# Patient Record
Sex: Female | Born: 1940 | Race: White | Hispanic: No | Smoking: Never smoker
Health system: Southern US, Community
[De-identification: ages and names within clinical notes are randomized; demographics above are authoritative.]

## PROBLEM LIST (undated history)

## (undated) DIAGNOSIS — M1990 Unspecified osteoarthritis, unspecified site: Secondary | ICD-10-CM

## (undated) DIAGNOSIS — F32A Depression, unspecified: Secondary | ICD-10-CM

## (undated) DIAGNOSIS — F329 Major depressive disorder, single episode, unspecified: Secondary | ICD-10-CM

## (undated) DIAGNOSIS — C801 Malignant (primary) neoplasm, unspecified: Secondary | ICD-10-CM

## (undated) DIAGNOSIS — I10 Essential (primary) hypertension: Secondary | ICD-10-CM

## (undated) HISTORY — PX: ABDOMINAL HYSTERECTOMY: SHX81

## (undated) HISTORY — PX: NEPHRECTOMY: SHX65

---

## 1999-09-18 ENCOUNTER — Encounter: Admission: RE | Admit: 1999-09-18 | Discharge: 1999-09-18 | Payer: Self-pay | Admitting: Rheumatology

## 1999-09-18 ENCOUNTER — Encounter: Payer: Self-pay | Admitting: Rheumatology

## 2001-01-21 ENCOUNTER — Encounter: Admission: RE | Admit: 2001-01-21 | Discharge: 2001-01-21 | Payer: Self-pay | Admitting: Rheumatology

## 2001-01-21 ENCOUNTER — Encounter: Payer: Self-pay | Admitting: Rheumatology

## 2002-04-22 ENCOUNTER — Encounter: Admission: RE | Admit: 2002-04-22 | Discharge: 2002-04-22 | Payer: Self-pay | Admitting: Rheumatology

## 2002-04-22 ENCOUNTER — Encounter: Payer: Self-pay | Admitting: Rheumatology

## 2002-08-18 ENCOUNTER — Other Ambulatory Visit: Admission: RE | Admit: 2002-08-18 | Discharge: 2002-08-18 | Payer: Self-pay | Admitting: Rheumatology

## 2003-08-03 ENCOUNTER — Encounter: Admission: RE | Admit: 2003-08-03 | Discharge: 2003-08-03 | Payer: Self-pay | Admitting: Rheumatology

## 2004-08-03 ENCOUNTER — Encounter: Admission: RE | Admit: 2004-08-03 | Discharge: 2004-08-03 | Payer: Self-pay | Admitting: Rheumatology

## 2005-08-07 ENCOUNTER — Encounter: Admission: RE | Admit: 2005-08-07 | Discharge: 2005-08-07 | Payer: Self-pay | Admitting: Rheumatology

## 2006-08-09 ENCOUNTER — Encounter: Admission: RE | Admit: 2006-08-09 | Discharge: 2006-08-09 | Payer: Self-pay | Admitting: Internal Medicine

## 2007-08-12 ENCOUNTER — Encounter: Admission: RE | Admit: 2007-08-12 | Discharge: 2007-08-12 | Payer: Self-pay | Admitting: Internal Medicine

## 2008-08-12 ENCOUNTER — Encounter: Admission: RE | Admit: 2008-08-12 | Discharge: 2008-08-12 | Payer: Self-pay | Admitting: Internal Medicine

## 2009-08-17 ENCOUNTER — Encounter: Admission: RE | Admit: 2009-08-17 | Discharge: 2009-08-17 | Payer: Self-pay | Admitting: Internal Medicine

## 2009-09-05 ENCOUNTER — Ambulatory Visit (HOSPITAL_COMMUNITY): Admission: RE | Admit: 2009-09-05 | Discharge: 2009-09-05 | Payer: Self-pay | Admitting: Urology

## 2009-09-21 ENCOUNTER — Inpatient Hospital Stay (HOSPITAL_COMMUNITY): Admission: RE | Admit: 2009-09-21 | Discharge: 2009-09-24 | Payer: Self-pay | Admitting: Urology

## 2009-09-21 ENCOUNTER — Encounter: Payer: Self-pay | Admitting: Urology

## 2010-01-16 ENCOUNTER — Ambulatory Visit (HOSPITAL_COMMUNITY): Admission: RE | Admit: 2010-01-16 | Discharge: 2010-01-16 | Payer: Self-pay | Admitting: Urology

## 2010-05-28 LAB — CBC
HCT: 33.2 % — ABNORMAL LOW (ref 36.0–46.0)
HCT: 38.7 % (ref 36.0–46.0)
Hemoglobin: 13.4 g/dL (ref 12.0–15.0)
MCH: 33.7 pg (ref 26.0–34.0)
MCV: 97.1 fL (ref 78.0–100.0)
RBC: 3.49 MIL/uL — ABNORMAL LOW (ref 3.87–5.11)
RBC: 3.63 MIL/uL — ABNORMAL LOW (ref 3.87–5.11)
RBC: 3.98 MIL/uL (ref 3.87–5.11)
RDW: 12 % (ref 11.5–15.5)
WBC: 10.2 10*3/uL (ref 4.0–10.5)
WBC: 8.9 10*3/uL (ref 4.0–10.5)

## 2010-05-28 LAB — DIFFERENTIAL
Basophils Absolute: 0 10*3/uL (ref 0.0–0.1)
Basophils Relative: 0 % (ref 0–1)
Basophils Relative: 0 % (ref 0–1)
Eosinophils Absolute: 0 10*3/uL (ref 0.0–0.7)
Eosinophils Relative: 0 % (ref 0–5)
Lymphs Abs: 0.4 10*3/uL — ABNORMAL LOW (ref 0.7–4.0)
Lymphs Abs: 0.8 10*3/uL (ref 0.7–4.0)
Monocytes Absolute: 0.6 10*3/uL (ref 0.1–1.0)
Neutro Abs: 7.9 10*3/uL — ABNORMAL HIGH (ref 1.7–7.7)
Neutro Abs: 9.2 10*3/uL — ABNORMAL HIGH (ref 1.7–7.7)
Neutrophils Relative %: 89 % — ABNORMAL HIGH (ref 43–77)

## 2010-05-28 LAB — BASIC METABOLIC PANEL
BUN: 12 mg/dL (ref 6–23)
CO2: 30 mEq/L (ref 19–32)
Calcium: 8.6 mg/dL (ref 8.4–10.5)
Calcium: 9.6 mg/dL (ref 8.4–10.5)
Chloride: 104 mEq/L (ref 96–112)
Creatinine, Ser: 0.94 mg/dL (ref 0.4–1.2)
GFR calc Af Amer: 48 mL/min — ABNORMAL LOW (ref >60)
GFR calc Af Amer: >60 mL/min (ref >60)
GFR calc non Af Amer: 39 mL/min — ABNORMAL LOW (ref >60)
Glucose, Bld: 106 mg/dL — ABNORMAL HIGH (ref 70–99)
Glucose, Bld: 159 mg/dL — ABNORMAL HIGH (ref 70–99)
Glucose, Bld: 186 mg/dL — ABNORMAL HIGH (ref 70–99)
Potassium: 4 mEq/L (ref 3.5–5.1)
Sodium: 142 mEq/L (ref 135–145)

## 2010-05-28 LAB — TYPE AND SCREEN: Antibody Screen: NEGATIVE

## 2010-05-28 LAB — SURGICAL PCR SCREEN: Staphylococcus aureus: NEGATIVE

## 2010-05-28 LAB — ABO/RH: ABO/RH(D): O POS

## 2010-07-07 ENCOUNTER — Other Ambulatory Visit: Payer: Self-pay | Admitting: Internal Medicine

## 2010-07-07 DIAGNOSIS — Z1231 Encounter for screening mammogram for malignant neoplasm of breast: Secondary | ICD-10-CM

## 2010-08-29 ENCOUNTER — Other Ambulatory Visit: Payer: Self-pay | Admitting: Urology

## 2010-08-29 ENCOUNTER — Ambulatory Visit (HOSPITAL_COMMUNITY)
Admission: RE | Admit: 2010-08-29 | Discharge: 2010-08-29 | Disposition: A | Discharge status: Home / Self Care | Payer: Medicare Other | Source: Ambulatory Visit | Attending: Urology | Admitting: Urology

## 2010-08-29 DIAGNOSIS — Z905 Acquired absence of kidney: Secondary | ICD-10-CM | POA: Insufficient documentation

## 2010-08-29 DIAGNOSIS — I10 Essential (primary) hypertension: Secondary | ICD-10-CM | POA: Insufficient documentation

## 2010-08-29 DIAGNOSIS — C649 Malignant neoplasm of unspecified kidney, except renal pelvis: Secondary | ICD-10-CM | POA: Insufficient documentation

## 2010-09-05 ENCOUNTER — Ambulatory Visit
Admission: RE | Admit: 2010-09-05 | Discharge: 2010-09-05 | Disposition: A | Discharge status: Home / Self Care | Payer: Medicare Other | Source: Ambulatory Visit | Attending: Internal Medicine | Admitting: Internal Medicine

## 2010-09-05 DIAGNOSIS — Z1231 Encounter for screening mammogram for malignant neoplasm of breast: Secondary | ICD-10-CM

## 2011-01-26 ENCOUNTER — Ambulatory Visit (HOSPITAL_COMMUNITY)
Admission: RE | Admit: 2011-01-26 | Discharge: 2011-01-26 | Disposition: A | Discharge status: Home / Self Care | Payer: Medicare Other | Source: Ambulatory Visit | Attending: Urology | Admitting: Urology

## 2011-01-26 ENCOUNTER — Other Ambulatory Visit: Payer: Self-pay | Admitting: Urology

## 2011-01-26 DIAGNOSIS — C649 Malignant neoplasm of unspecified kidney, except renal pelvis: Secondary | ICD-10-CM

## 2011-07-30 ENCOUNTER — Other Ambulatory Visit: Payer: Self-pay | Admitting: Urology

## 2011-07-30 ENCOUNTER — Ambulatory Visit (HOSPITAL_COMMUNITY)
Admission: RE | Admit: 2011-07-30 | Discharge: 2011-07-30 | Disposition: A | Discharge status: Home / Self Care | Payer: Medicare Other | Source: Ambulatory Visit | Attending: Urology | Admitting: Urology

## 2011-07-30 DIAGNOSIS — M899 Disorder of bone, unspecified: Secondary | ICD-10-CM | POA: Insufficient documentation

## 2011-07-30 DIAGNOSIS — M4120 Other idiopathic scoliosis, site unspecified: Secondary | ICD-10-CM | POA: Insufficient documentation

## 2011-07-30 DIAGNOSIS — C649 Malignant neoplasm of unspecified kidney, except renal pelvis: Secondary | ICD-10-CM | POA: Insufficient documentation

## 2011-08-01 ENCOUNTER — Other Ambulatory Visit: Payer: Self-pay | Admitting: Internal Medicine

## 2011-08-01 DIAGNOSIS — Z1231 Encounter for screening mammogram for malignant neoplasm of breast: Secondary | ICD-10-CM

## 2011-09-06 ENCOUNTER — Ambulatory Visit
Admission: RE | Admit: 2011-09-06 | Discharge: 2011-09-06 | Disposition: A | Discharge status: Home / Self Care | Payer: Medicare Other | Source: Ambulatory Visit | Attending: Internal Medicine | Admitting: Internal Medicine

## 2011-09-06 DIAGNOSIS — Z1231 Encounter for screening mammogram for malignant neoplasm of breast: Secondary | ICD-10-CM

## 2012-07-28 ENCOUNTER — Other Ambulatory Visit: Payer: Self-pay

## 2012-07-28 DIAGNOSIS — Z1231 Encounter for screening mammogram for malignant neoplasm of breast: Secondary | ICD-10-CM

## 2012-07-29 ENCOUNTER — Other Ambulatory Visit: Payer: Self-pay | Admitting: Urology

## 2012-07-29 ENCOUNTER — Ambulatory Visit (HOSPITAL_COMMUNITY)
Admission: RE | Admit: 2012-07-29 | Discharge: 2012-07-29 | Disposition: A | Discharge status: Home / Self Care | Payer: Medicare Other | Source: Ambulatory Visit | Attending: Urology | Admitting: Urology

## 2012-07-29 DIAGNOSIS — M4120 Other idiopathic scoliosis, site unspecified: Secondary | ICD-10-CM | POA: Insufficient documentation

## 2012-07-29 DIAGNOSIS — C649 Malignant neoplasm of unspecified kidney, except renal pelvis: Secondary | ICD-10-CM

## 2012-07-29 DIAGNOSIS — R059 Cough, unspecified: Secondary | ICD-10-CM | POA: Insufficient documentation

## 2012-07-29 DIAGNOSIS — R05 Cough: Secondary | ICD-10-CM | POA: Insufficient documentation

## 2012-07-29 DIAGNOSIS — Z87891 Personal history of nicotine dependence: Secondary | ICD-10-CM | POA: Insufficient documentation

## 2012-07-29 DIAGNOSIS — I10 Essential (primary) hypertension: Secondary | ICD-10-CM | POA: Insufficient documentation

## 2012-09-08 ENCOUNTER — Ambulatory Visit
Admission: RE | Admit: 2012-09-08 | Discharge: 2012-09-08 | Disposition: A | Discharge status: Home / Self Care | Payer: Medicare Other | Source: Ambulatory Visit

## 2012-09-08 DIAGNOSIS — Z1231 Encounter for screening mammogram for malignant neoplasm of breast: Secondary | ICD-10-CM

## 2013-07-31 ENCOUNTER — Other Ambulatory Visit: Payer: Self-pay | Admitting: Urology

## 2013-07-31 ENCOUNTER — Ambulatory Visit (HOSPITAL_COMMUNITY)
Admission: RE | Admit: 2013-07-31 | Discharge: 2013-07-31 | Disposition: A | Discharge status: Home / Self Care | Payer: Medicare Other | Source: Ambulatory Visit | Attending: Urology | Admitting: Urology

## 2013-07-31 DIAGNOSIS — C649 Malignant neoplasm of unspecified kidney, except renal pelvis: Secondary | ICD-10-CM

## 2013-08-06 ENCOUNTER — Other Ambulatory Visit: Payer: Self-pay

## 2013-08-06 DIAGNOSIS — Z1231 Encounter for screening mammogram for malignant neoplasm of breast: Secondary | ICD-10-CM

## 2013-09-10 ENCOUNTER — Ambulatory Visit
Admission: RE | Admit: 2013-09-10 | Discharge: 2013-09-10 | Disposition: A | Discharge status: Home / Self Care | Payer: Medicare Other | Source: Ambulatory Visit

## 2013-09-10 DIAGNOSIS — Z1231 Encounter for screening mammogram for malignant neoplasm of breast: Secondary | ICD-10-CM

## 2014-08-10 ENCOUNTER — Other Ambulatory Visit: Payer: Self-pay

## 2014-08-10 DIAGNOSIS — Z1231 Encounter for screening mammogram for malignant neoplasm of breast: Secondary | ICD-10-CM

## 2014-09-14 ENCOUNTER — Ambulatory Visit
Admission: RE | Admit: 2014-09-14 | Discharge: 2014-09-14 | Disposition: A | Discharge status: Home / Self Care | Payer: Medicare Other | Source: Ambulatory Visit

## 2014-09-14 DIAGNOSIS — Z1231 Encounter for screening mammogram for malignant neoplasm of breast: Secondary | ICD-10-CM

## 2015-08-09 ENCOUNTER — Other Ambulatory Visit: Payer: Self-pay

## 2015-08-09 DIAGNOSIS — Z1231 Encounter for screening mammogram for malignant neoplasm of breast: Secondary | ICD-10-CM

## 2015-09-15 ENCOUNTER — Ambulatory Visit: Payer: Medicare Other

## 2015-10-11 ENCOUNTER — Ambulatory Visit
Admission: RE | Admit: 2015-10-11 | Discharge: 2015-10-11 | Disposition: A | Discharge status: Home / Self Care | Payer: Medicare Other | Source: Ambulatory Visit

## 2015-10-11 DIAGNOSIS — Z1231 Encounter for screening mammogram for malignant neoplasm of breast: Secondary | ICD-10-CM

## 2016-06-23 ENCOUNTER — Observation Stay (HOSPITAL_COMMUNITY)
Admission: EM | Admit: 2016-06-23 | Discharge: 2016-06-24 | Disposition: A | Discharge status: Home / Self Care | Payer: Medicare Other | Attending: Student in an Organized Health Care Education/Training Program | Admitting: Student in an Organized Health Care Education/Training Program

## 2016-06-23 ENCOUNTER — Emergency Department (HOSPITAL_COMMUNITY): Payer: Medicare Other

## 2016-06-23 ENCOUNTER — Encounter (HOSPITAL_COMMUNITY): Payer: Self-pay | Admitting: *Deleted

## 2016-06-23 ENCOUNTER — Observation Stay (HOSPITAL_COMMUNITY): Payer: Medicare Other

## 2016-06-23 DIAGNOSIS — R935 Abnormal findings on diagnostic imaging of other abdominal regions, including retroperitoneum: Secondary | ICD-10-CM | POA: Diagnosis not present

## 2016-06-23 DIAGNOSIS — R072 Precordial pain: Secondary | ICD-10-CM | POA: Diagnosis present

## 2016-06-23 DIAGNOSIS — Z79899 Other long term (current) drug therapy: Secondary | ICD-10-CM | POA: Insufficient documentation

## 2016-06-23 DIAGNOSIS — R29810 Facial weakness: Secondary | ICD-10-CM | POA: Insufficient documentation

## 2016-06-23 DIAGNOSIS — R0789 Other chest pain: Principal | ICD-10-CM | POA: Insufficient documentation

## 2016-06-23 DIAGNOSIS — G9389 Other specified disorders of brain: Secondary | ICD-10-CM

## 2016-06-23 DIAGNOSIS — I10 Essential (primary) hypertension: Secondary | ICD-10-CM | POA: Diagnosis not present

## 2016-06-23 DIAGNOSIS — D496 Neoplasm of unspecified behavior of brain: Secondary | ICD-10-CM

## 2016-06-23 DIAGNOSIS — R079 Chest pain, unspecified: Secondary | ICD-10-CM | POA: Diagnosis present

## 2016-06-23 DIAGNOSIS — G939 Disorder of brain, unspecified: Secondary | ICD-10-CM | POA: Insufficient documentation

## 2016-06-23 HISTORY — DX: Major depressive disorder, single episode, unspecified: F32.9

## 2016-06-23 HISTORY — DX: Unspecified osteoarthritis, unspecified site: M19.90

## 2016-06-23 HISTORY — DX: Depression, unspecified: F32.A

## 2016-06-23 HISTORY — DX: Malignant (primary) neoplasm, unspecified: C80.1

## 2016-06-23 HISTORY — DX: Essential (primary) hypertension: I10

## 2016-06-23 LAB — CBC
HEMATOCRIT: 40.5 % (ref 36.0–46.0)
Hemoglobin: 13.8 g/dL (ref 12.0–15.0)
MCH: 32.7 pg (ref 26.0–34.0)
MCHC: 34.1 g/dL (ref 30.0–36.0)
MCV: 96 fL (ref 78.0–100.0)
Platelets: 163 10*3/uL (ref 150–400)
RBC: 4.22 MIL/uL (ref 3.87–5.11)
RDW: 12 % (ref 11.5–15.5)
WBC: 6.1 10*3/uL (ref 4.0–10.5)

## 2016-06-23 LAB — BASIC METABOLIC PANEL
ANION GAP: 10 (ref 5–15)
BUN: 16 mg/dL (ref 6–20)
CALCIUM: 9.8 mg/dL (ref 8.9–10.3)
CO2: 25 mmol/L (ref 22–32)
Chloride: 103 mmol/L (ref 101–111)
Creatinine, Ser: 1.38 mg/dL — ABNORMAL HIGH (ref 0.44–1.00)
GFR, EST AFRICAN AMERICAN: 42 mL/min — AB (ref >60)
GFR, EST NON AFRICAN AMERICAN: 36 mL/min — AB (ref >60)
Glucose, Bld: 126 mg/dL — ABNORMAL HIGH (ref 65–99)
POTASSIUM: 3.7 mmol/L (ref 3.5–5.1)
SODIUM: 138 mmol/L (ref 135–145)

## 2016-06-23 LAB — URINALYSIS, ROUTINE W REFLEX MICROSCOPIC
Bilirubin Urine: NEGATIVE
GLUCOSE, UA: NEGATIVE mg/dL
Hgb urine dipstick: NEGATIVE
KETONES UR: NEGATIVE mg/dL
LEUKOCYTES UA: NEGATIVE
Nitrite: NEGATIVE
PROTEIN: NEGATIVE mg/dL
Specific Gravity, Urine: 1.005 (ref 1.005–1.030)
pH: 7 (ref 5.0–8.0)

## 2016-06-23 LAB — I-STAT TROPONIN, ED: TROPONIN I, POC: 0 ng/mL (ref 0.00–0.08)

## 2016-06-23 LAB — D-DIMER, QUANTITATIVE (NOT AT ARMC): D DIMER QUANT: 0.49 ug{FEU}/mL (ref 0.00–0.50)

## 2016-06-23 MED ORDER — SENNOSIDES-DOCUSATE SODIUM 8.6-50 MG PO TABS: 1.0000 | ORAL_TABLET | Freq: Every evening | ORAL | Status: DC | PRN

## 2016-06-23 MED ORDER — SODIUM CHLORIDE 0.9% FLUSH
3.0000 mL | Freq: Two times a day (BID) | INTRAVENOUS | Status: DC
  Administered 2016-06-23 – 2016-06-24 (×2): 3 mL via INTRAVENOUS

## 2016-06-23 MED ORDER — DEXAMETHASONE SODIUM PHOSPHATE 10 MG/ML IJ SOLN
10.0000 mg | Freq: Four times a day (QID) | INTRAMUSCULAR | Status: AC
  Administered 2016-06-23 – 2016-06-24 (×4): 10 mg via INTRAVENOUS
  Filled 2016-06-23 (×4): qty 1

## 2016-06-23 MED ORDER — ACETAMINOPHEN 650 MG RE SUPP: 650.0000 mg | Freq: Four times a day (QID) | RECTAL | Status: DC | PRN

## 2016-06-23 MED ORDER — SODIUM CHLORIDE 0.9 % IV BOLUS (SEPSIS)
1000.0000 mL | Freq: Once | INTRAVENOUS | Status: AC
  Administered 2016-06-23: 1000 mL via INTRAVENOUS

## 2016-06-23 MED ORDER — HEPARIN SODIUM (PORCINE) 5000 UNIT/ML IJ SOLN
5000.0000 [IU] | Freq: Three times a day (TID) | INTRAMUSCULAR | Status: DC
  Administered 2016-06-23: 5000 [IU] via SUBCUTANEOUS
  Filled 2016-06-23 (×2): qty 1

## 2016-06-23 MED ORDER — ASPIRIN 81 MG PO CHEW
324.0000 mg | CHEWABLE_TABLET | Freq: Once | ORAL | Status: AC
  Administered 2016-06-23: 324 mg via ORAL
  Filled 2016-06-23: qty 4

## 2016-06-23 MED ORDER — NITROGLYCERIN 0.4 MG SL SUBL
0.4000 mg | SUBLINGUAL_TABLET | SUBLINGUAL | Status: DC | PRN
  Administered 2016-06-23: 0.4 mg via SUBLINGUAL
  Filled 2016-06-23: qty 1

## 2016-06-23 MED ORDER — PROCHLORPERAZINE EDISYLATE 5 MG/ML IJ SOLN
10.0000 mg | Freq: Four times a day (QID) | INTRAMUSCULAR | Status: DC | PRN
  Administered 2016-06-23: 10 mg via INTRAVENOUS
  Filled 2016-06-23: qty 2

## 2016-06-23 MED ORDER — ACETAMINOPHEN 325 MG PO TABS
650.0000 mg | ORAL_TABLET | Freq: Four times a day (QID) | ORAL | Status: DC | PRN
  Administered 2016-06-23: 650 mg via ORAL
  Filled 2016-06-23: qty 2

## 2016-06-23 MED ORDER — DEXAMETHASONE SODIUM PHOSPHATE 10 MG/ML IJ SOLN
10.0000 mg | Freq: Once | INTRAMUSCULAR | Status: AC
  Administered 2016-06-23: 10 mg via INTRAVENOUS
  Filled 2016-06-23: qty 1

## 2016-06-23 MED ORDER — CLORAZEPATE DIPOTASSIUM 3.75 MG PO TABS
7.5000 mg | ORAL_TABLET | Freq: Two times a day (BID) | ORAL | Status: DC | PRN
  Administered 2016-06-23: 7.5 mg via ORAL
  Filled 2016-06-23: qty 2

## 2016-06-23 NOTE — ED Provider Notes (Signed)
[facility] DEPT Provider Note   CSN: [id number] Arrival date & time: 06/23/16  1216     History   Chief Complaint Chief Complaint  Patient presents with  . Chest Pain    HPI Theresa Mckinney is a 76 y.o. female.  The history is provided by the patient.  Chest Pain   This is a new problem. The current episode started 3 to 5 hours ago. The problem occurs constantly. The problem has not changed since onset.The pain is present in the substernal region. The quality of the pain is described as dull, heavy and pressure-like. The pain radiates to the left arm. Associated symptoms include diaphoresis and nausea. Pertinent negatives include no cough, no fever, no lower extremity edema, no shortness of breath and no vomiting. Risk factors include obesity and being elderly.  Her past medical history is significant for cancer, hyperlipidemia and hypertension.  Pertinent negatives for past medical history include no diabetes, no DVT, no MI, no PE and no strokes.  Her family medical history is significant for CAD.  Procedure history is negative for cardiac catheterization and exercise treadmill test.    Past Medical History:  Diagnosis Date  . Arthritis   . Cancer ([place])   . Depression   . Hypertension     There are no active problems to display for this patient.   Past Surgical History:  Procedure Laterality Date  . ABDOMINAL HYSTERECTOMY    . NEPHRECTOMY      OB History    No data available       Home Medications    Prior to Admission medications   Medication Sig Start Date End Date Taking? Authorizing Provider  acetaminophen (TYLENOL) 500 MG tablet Take 1,000 mg by mouth every 6 (six) hours as needed for moderate pain or headache.   Yes Historical Provider, MD  cholecalciferol (VITAMIN D) 1000 units tablet Take 2,000 Units by mouth daily.   Yes Historical Provider, MD  clorazepate (TRANXENE) 7.5 MG tablet Take 7.5 mg by mouth 2 (two) times daily as needed for anxiety.   Yes  Historical Provider, MD  Flaxseed, Linseed, (FLAX SEED OIL) 1000 MG CAPS Take 2,000 mg by mouth 2 (two) times daily.   Yes Historical Provider, MD  hydrALAZINE (APRESOLINE) 25 MG tablet Take 25 mg by mouth 2 (two) times daily.   Yes Historical Provider, MD  loratadine (CLARITIN) 10 MG tablet Take 10 mg by mouth daily.   Yes Historical Provider, MD  nebivolol (BYSTOLIC) 5 MG tablet Take 5 mg by mouth daily.   Yes Historical Provider, MD    Family History No family history on file.  Social History Social History  Substance Use Topics  . Smoking status: Never Smoker  . Smokeless tobacco: Never Used  . Alcohol use No     Allergies   Aristocort [triamcinolone]; Ciprofloxacin hcl; Codeine; Demerol [meperidine]; Influenza vaccine live; Macrolides and ketolides; Morphine and related; Other; Oxycodone; Simvastatin; Penicillins; and Septra [sulfamethoxazole-trimethoprim]   Review of Systems Review of Systems  Constitutional: Positive for diaphoresis. Negative for fever.  Respiratory: Negative for cough and shortness of breath.   Cardiovascular: Positive for chest pain.  Gastrointestinal: Positive for nausea. Negative for vomiting.  All other systems are reviewed and are negative for acute change except as noted in the HPI    Physical Exam Updated Vital Signs BP 136/82   Pulse (!) 58   Resp 15   Ht 5' (1.524 m)   SpO2 98%   Physical Exam  Constitutional: She is oriented to person, place, and time. She appears well-developed and well-nourished. No distress.  HENT:  Head: Normocephalic and atraumatic.  Nose: Nose normal.  Eyes: Conjunctivae and EOM are normal. Pupils are equal, round, and reactive to light. Right eye exhibits no discharge. Left eye exhibits no discharge. No scleral icterus.  Neck: Normal range of motion. Neck supple.  Cardiovascular: Normal rate and regular rhythm.  Exam reveals no gallop and no friction rub.   No murmur heard. Pulmonary/Chest: Effort normal and  breath sounds normal. No stridor. No respiratory distress. She has no rales.  Abdominal: Soft. She exhibits no distension. There is no tenderness.  Musculoskeletal: She exhibits no edema or tenderness.  Neurological: She is alert and oriented to person, place, and time.  Mental Status: Alert and oriented to person, place, and time. Attention and concentration normal. Speech clear. Recent memory is intact  Cranial Nerves  II Visual Fields: Intact to confrontation. Visual fields intact. III, IV, VI: Pupils equal and reactive to light and near. Full eye movement without nystagmus  V Facial Sensation: Normal. No weakness of masticatory muscles  VII: left facial droop with intact forehead strength. VIII Auditory Acuity: Grossly normal  IX/X: The uvula is midline; the palate elevates symmetrically  XI: Normal sternocleidomastoid and trapezius strength  XII: The tongue is midline. No atrophy or fasciculations.   Motor System: Muscle Strength: 5/5 and symmetric in the upper and lower extremities. No pronation or drift.  Muscle Tone: Tone and muscle bulk are normal in the upper and lower extremities.   Reflexes: DTRs: 2+ and symmetrical in all four extremities. Plantar responses are flexor bilaterally.  Coordination: Intact finger-to-nose, heel-to-shin, and rapid alternating movements. No tremor.  Sensation: Intact to light touch, and pinprick. Negative Romberg test.  Gait: Routine and tandem gait are normal    Skin: Skin is warm and dry. No rash noted. She is not diaphoretic. No erythema.  Psychiatric: She has a normal mood and affect.  Vitals reviewed.    ED Treatments / Results  Labs (all labs ordered are listed, but only abnormal results are displayed) Labs Reviewed  BASIC METABOLIC PANEL - Abnormal; Notable for the following:       Result Value   Glucose, Bld 126 (*)    Creatinine, Ser 1.38 (*)    GFR calc non Af Amer 36 (*)    GFR calc Af Amer 42 (*)    All other components within  normal limits  URINALYSIS, ROUTINE W REFLEX MICROSCOPIC - Abnormal; Notable for the following:    Color, Urine STRAW (*)    All other components within normal limits  CBC  D-DIMER, QUANTITATIVE (NOT AT [facility])  I-STAT TROPOININ, ED    EKG  EKG Interpretation  Date/Time:  Saturday June 23 2016 12:23:21 EDT Ventricular Rate:  61 PR Interval:  160 QRS Duration: 76 QT Interval:  452 QTC Calculation: 455 R Axis:   56 Text Interpretation:  Normal sinus rhythm Normal ECG No significant change since last tracing Confirmed by [facility] MD, Lachrista Heslin ([id number]) on 06/23/2016 1:10:34 PM       Radiology Dg Chest 2 View  Result Date: 06/23/2016 CLINICAL DATA:  Acute chest pain. EXAM: CHEST  2 VIEW COMPARISON:  07/31/2013 and prior exams FINDINGS: The cardiomediastinal silhouette is unremarkable. There is no evidence of focal airspace disease, pulmonary edema, suspicious pulmonary nodule/mass, pleural effusion, or pneumothorax. No acute bony abnormalities are identified. IMPRESSION: No active cardiopulmonary disease. Electronically Signed   By: Dellis Filbert  Hu M.D.   On: 06/23/2016 13:53   Ct Head Wo Contrast  Result Date: 06/23/2016 CLINICAL DATA:  Left-sided facial droop.  Left-sided weakness. EXAM: CT HEAD WITHOUT CONTRAST TECHNIQUE: Contiguous axial images were obtained from the base of the skull through the vertex without intravenous contrast. COMPARISON:  None. FINDINGS: Brain: A mass lesion is present in the right occipital lobe measuring 4.0 x 3.1 x 3.6 cm. There is marked surrounding vasogenic edema which extends into the right temporal lobe and posterior right parietal lobe. There is effacement of the sulci locally. The occipital horn of the right lateral ventricle is effaced. No other focal lesions are present. There is no evidence for ischemic infarct. The basal ganglia and insular ribbon are intact bilaterally. The cortex is otherwise within normal limits. Vascular: No hyperdense vessel or unexpected  calcification. Skull: The calvarium is intact. No focal lytic or blastic lesions are present. Sinuses/Orbits: A fluid level is present in the right maxillary sinus. There is fluid in the right sphenoid sinus. Mild mucosal thickening is noted in the left maxillary sinus and scattered throughout the sphenoid sinuses. Mastoid air cells are clear bilaterally. The globes and orbits are within normal limits bilaterally. Other: 1. Right occipital mass measuring 4.0 x 3.1 x 3.6 cm IMPRESSION: 1. Right occipital mass measuring 4.0 x 3.1 x 3.6 cm with significant surrounding vasogenic edema. 2. Mass effect results in effacement of the adjacent sulci and occipital horn of the right lateral ventricle. 3. No other focal lesions. 4. No evidence for acute ischemic infarct. 5. Recommend MRI the brain without with contrast for further evaluation. If this is a solitary lesion, a primary brain tumor such as GBM is favored over metastatic disease. 6. Acute and chronic sinus disease as described. Electronically Signed   By: San Morelle M.D.   On: 06/23/2016 16:16     Procedures Procedures (including critical care time)  Medications Ordered in ED Medications  nitroGLYCERIN (NITROSTAT) SL tablet 0.4 mg (0.4 mg Sublingual Given 06/23/16 1329)  aspirin chewable tablet 324 mg (324 mg Oral Given 06/23/16 1327)  sodium chloride 0.9 % bolus 1,000 mL (1,000 mLs Intravenous New Bag/Given 06/23/16 1502)     Initial Impression / Assessment and Plan / ED Course  I have reviewed the triage vital signs and the nursing notes.  Pertinent labs & imaging results that were available during my care of the patient were reviewed by me and considered in my medical decision making (see chart for details).     1. Chest pain Atypical chest pain. EKG without acute ischemic changes or evidence of pericarditis. Initial troponin negative. Patient is high risk for ACS and will require admission for ACS rule out.  D-dimer negative; doubt  PE. Presentation is classic for aortic dissection or esophageal perforation.  Chest x-ray without evidence suggestive of pneumonia, pneumothorax, pneumomediastinum.  No abnormal contour of the mediastinum to suggest dissection. No evidence of acute injuries.   2. Facial droop No other focal neurologic deficits noted on exam. Unsure of onset time. CT head revealed a large right occipital/parietal lobe mass with vasogenic edema.  Final Clinical Impressions(s) / ED Diagnoses   Final diagnoses:  Brain mass  Atypical chest pain  Facial droop      Fatima Blank, MD 06/24/16 [phone]

## 2016-06-23 NOTE — ED Notes (Signed)
Patient transported to X-ray 

## 2016-06-23 NOTE — ED Triage Notes (Signed)
Pt presents with c/o CP. The pain began Thursday but resolved, then returned when she woke this morning. She also noticed a heavy, numb sensation to her L arm/shoulder when waking this morning as well as some nausea. She tried to put a pillow under her arm with no improvement.

## 2016-06-23 NOTE — ED Notes (Signed)
Admitting Provider at bedside. 

## 2016-06-23 NOTE — ED Notes (Signed)
Pt back to bed from BR, c/o headache.

## 2016-06-23 NOTE — Progress Notes (Signed)
Responded to consult for major life transitions, where chart noted discovery of brain mass, and pt distraught. Pt was in rm w/ several family members, who looked up, smiled or made eye contact when I entered rm, greeting them, but pt did not. She had just rec'd a food tray and was beginning to eat. I said I wouldn't disturb her dinner now, esp. w/ all her family there, but would be able to come back tomorrow if she would like. Pt barely looked up to meet my eyes, but nodded and said yes softly. Chaplain available for f/u.    06/23/16 1900  Clinical Encounter Type  Visited With Patient and family together  Visit Type Initial;Psychological support;Spiritual support;Social support  Referral From Nurse  Stress Factors  Patient Stress Factors Health changes;Loss of control  Family Stress Factors Family relationships;Health changes;Loss of control   Gerrit Heck, Chaplain

## 2016-06-23 NOTE — H&P (Signed)
Date: 06/23/2016               Patient Name:  Theresa Mckinney MRN: [id number]  DOB: 11/06/1940 Age / Sex: 76 y.o., female   PCP: Provider Not In System         Medical Service: Internal Medicine Teaching Service         Attending Physician: Dr. Axel Filler, MD    First Contact: Dr. Inda Castle Pager: [phone]  Second Contact: Dr. Tiburcio Pea Pager: [phone]       After Hours (After 5p/  First Contact Pager: [phone]  weekends / holidays): Second Contact Pager: [phone]   Chief Complaint: "My left arm's been having pins and needles."  History of Present Illness: Theresa Mckinney is a 76 y.o. female with history of HTN and RCC (s/p L nephrectomy 2011) who presents with intermittent left arm numbness and parasthesias, left facial droop, and headaches.  Last Thursday she had an episode of left arm numbness and tingling which resolved.  Today, she had another similar episode of left arm numbness and also some chest pain which radiated to the back, prompting her sister to bring her in for evaluation.  Her sister also thought that her face looked somewhat different with some "puffiness" today as well.  She endorses some headaches for the past several months, usually in the back of her head.  She cannot describe any aggravating or alleviating factors or associated symptoms.  They sometimes seems to respond to Tylenol.  She currently has a severe occipital headache which worsened after getting nitroglycerin in the ED.  Before this week she has been in her usual state of health, living independently.  Denies any diplopia, falls, confusion, difficulty speaking, weakness in arms/legs, fevers or chill, weight gain/loss, or other symptoms.  Has thought that peoples faces on TV looked strange, like they bulge out towards her.  Interview and exam immediately followed disclosure of brain mass by ED provider.  Theresa Mckinney was distracted and distraught, limiting her participation.  Meds:  Current Meds    Medication Sig  . acetaminophen (TYLENOL) 500 MG tablet Take 1,000 mg by mouth every 6 (six) hours as needed for moderate pain or headache.  . cholecalciferol (VITAMIN D) 1000 units tablet Take 2,000 Units by mouth daily.  . clorazepate (TRANXENE) 7.5 MG tablet Take 7.5 mg by mouth 2 (two) times daily as needed for anxiety.  . Flaxseed, Linseed, (FLAX SEED OIL) 1000 MG CAPS Take 2,000 mg by mouth 2 (two) times daily.  . hydrALAZINE (APRESOLINE) 25 MG tablet Take 25 mg by mouth 2 (two) times daily.  Marland Kitchen loratadine (CLARITIN) 10 MG tablet Take 10 mg by mouth daily.  . nebivolol (BYSTOLIC) 5 MG tablet Take 5 mg by mouth daily.     Allergies: Allergies as of 06/23/2016 - Review Complete 06/23/2016  Allergen Reaction Noted  . Aristocort [triamcinolone] Other (See Comments) 06/23/2016  . Ciprofloxacin hcl Other (See Comments) 06/23/2016  . Codeine Nausea Only 06/23/2016  . Demerol [meperidine] Nausea Only 06/23/2016  . Influenza vaccine live Swelling and Other (See Comments) 06/23/2016  . Macrolides and ketolides Nausea Only 06/23/2016  . Morphine and related Nausea Only 06/23/2016  . Other Other (See Comments) 06/23/2016  . Oxycodone Other (See Comments) 06/23/2016  . Simvastatin Other (See Comments) 06/23/2016  . Penicillins Rash 06/23/2016  . Septra [sulfamethoxazole-trimethoprim] Rash 06/23/2016   Past Medical History:  Diagnosis Date  . Arthritis   . Cancer ([place])   .  Depression   . Hypertension    Family History:  No family history CNS of malignancy.  Social History:  Lives alone in house.  Sister and son live in [place].   Review of Systems: A complete ROS was negative except as per HPI. Never smoker, no alcohol or drugs.  Physical Exam: Blood pressure (!) 147/59, pulse 79, temperature 97.9 F (36.6 C), temperature source Oral, resp. rate 15, height 5' (1.524 m), weight 132 lb 9.6 oz (60.1 kg), SpO2 98 %.  Physical Exam  Constitutional: She appears well-developed  and well-nourished. No distress.  Tearful  HENT:  Head: Normocephalic and atraumatic.  Mouth/Throat: Oropharynx is clear and moist. No oropharyngeal exudate.  Eyes: Conjunctivae are normal. No scleral icterus.  Neck: Normal range of motion. Neck supple.  Cardiovascular: Normal rate and regular rhythm.   Pulmonary/Chest: Effort normal and breath sounds normal.  Abdominal: Soft. She exhibits no distension. There is no tenderness.  Musculoskeletal: She exhibits no edema or tenderness.  Neurological:  Full exam deferred due to emotional distress Alert, oriented VA better than count fingers bilaterally VFF constricted upper left quadrant OU Difficultly performing directed gaze task, but EOMI, no nystagmus PERRL Sensation intact to light touch throughout Tongue protrudes midline Normal phonation Hearing intact to conversational speech LUE 4+/5 SA, EE, EF, WF, WE, grip LUE, biLE 5/5 throughout DTR 2+ patella bilaterally  Psychiatric:  Distracted Speech hesitant, non linear Pleading to god to "take her away"    CBC Latest Ref Rng & Units 06/23/2016 09/22/2009 09/21/2009  WBC 4.0 - 10.5 K/uL 6.1 10.2 8.9  Hemoglobin 12.0 - 15.0 g/dL 13.8 11.7(L) 12.2  Hematocrit 36.0 - 46.0 % 40.5 33.2(L) 34.8(L)  Platelets 150 - 400 K/uL 163 195 198   CMP Latest Ref Rng & Units 06/23/2016 09/22/2009 09/21/2009  Glucose 65 - 99 mg/dL 126(H) 186(H) 159(H)  BUN 6 - 20 mg/dL 16 12 10   Creatinine 0.44 - 1.00 mg/dL 1.38(H) 1.33(H) 0.99  Sodium 135 - 145 mmol/L 138 136 138  Potassium 3.5 - 5.1 mmol/L 3.7 5.0 DELTA CHECK NOTED 4.0  Chloride 101 - 111 mmol/L 103 105 104  CO2 22 - 32 mmol/L 25 23 25   Calcium 8.9 - 10.3 mg/dL 9.8 8.6 8.6   Troponin (Point of Care Test)  Recent Labs  06/23/16 1232  TROPIPOC 0.00   Assessment & Plan by Problem: Principal Problem:   Brain mass Active Problems:   Chest pain   Essential hypertension   76 y.o. female with newly discovered right occipital brain mass  concerning for malignancy.    #Brain Mass -MRI w/ and w/o contrast -dexamethasone 10 mg IV Q6H -compazine PRN -neurosurgery consult in AM  #Chest Pain Resolved. EKG , troponin undetectable, normal EKG, HEART score 3 (low risk).  No further work-up at this time. -Monitor for symptoms  #HTN On hydralazine and nebivolol.  Normotensive here. -Monitor BPs  #Anxiety -Continue home tranxene BID  Fluids: none Diet: heart DVT Prophylaxis: heparin Code Status: Discussion deferred.  Full code.  Dispo: Admit patient to Observation with expected length of stay less than 2 midnights.  Signed: Minus Liberty, MD 06/23/2016, 6:08 PM  Pager: [phone]

## 2016-06-24 ENCOUNTER — Encounter (HOSPITAL_COMMUNITY): Payer: Self-pay

## 2016-06-24 ENCOUNTER — Observation Stay (HOSPITAL_COMMUNITY): Payer: Medicare Other

## 2016-06-24 DIAGNOSIS — Z85528 Personal history of other malignant neoplasm of kidney: Secondary | ICD-10-CM

## 2016-06-24 DIAGNOSIS — Z887 Allergy status to serum and vaccine status: Secondary | ICD-10-CM

## 2016-06-24 DIAGNOSIS — Z880 Allergy status to penicillin: Secondary | ICD-10-CM

## 2016-06-24 DIAGNOSIS — Z905 Acquired absence of kidney: Secondary | ICD-10-CM

## 2016-06-24 DIAGNOSIS — I10 Essential (primary) hypertension: Secondary | ICD-10-CM | POA: Diagnosis not present

## 2016-06-24 DIAGNOSIS — Z888 Allergy status to other drugs, medicaments and biological substances status: Secondary | ICD-10-CM

## 2016-06-24 DIAGNOSIS — Z882 Allergy status to sulfonamides status: Secondary | ICD-10-CM

## 2016-06-24 DIAGNOSIS — G939 Disorder of brain, unspecified: Secondary | ICD-10-CM | POA: Diagnosis not present

## 2016-06-24 DIAGNOSIS — R079 Chest pain, unspecified: Secondary | ICD-10-CM | POA: Diagnosis not present

## 2016-06-24 DIAGNOSIS — Z881 Allergy status to other antibiotic agents status: Secondary | ICD-10-CM

## 2016-06-24 DIAGNOSIS — Z885 Allergy status to narcotic agent status: Secondary | ICD-10-CM

## 2016-06-24 LAB — BASIC METABOLIC PANEL
Anion gap: 10 (ref 5–15)
BUN: 17 mg/dL (ref 6–20)
CALCIUM: 9.1 mg/dL (ref 8.9–10.3)
CO2: 23 mmol/L (ref 22–32)
CREATININE: 1.22 mg/dL — AB (ref 0.44–1.00)
Chloride: 107 mmol/L (ref 101–111)
GFR calc non Af Amer: 42 mL/min — ABNORMAL LOW (ref >60)
GFR, EST AFRICAN AMERICAN: 48 mL/min — AB (ref >60)
Glucose, Bld: 136 mg/dL — ABNORMAL HIGH (ref 65–99)
Potassium: 3.9 mmol/L (ref 3.5–5.1)
Sodium: 140 mmol/L (ref 135–145)

## 2016-06-24 LAB — CBC
HEMATOCRIT: 36.7 % (ref 36.0–46.0)
Hemoglobin: 12.6 g/dL (ref 12.0–15.0)
MCH: 32.6 pg (ref 26.0–34.0)
MCHC: 34.3 g/dL (ref 30.0–36.0)
MCV: 94.8 fL (ref 78.0–100.0)
Platelets: 161 10*3/uL (ref 150–400)
RBC: 3.87 MIL/uL (ref 3.87–5.11)
RDW: 12.1 % (ref 11.5–15.5)
WBC: 4.5 10*3/uL (ref 4.0–10.5)

## 2016-06-24 LAB — GLUCOSE, CAPILLARY: GLUCOSE-CAPILLARY: 125 mg/dL — AB (ref 65–99)

## 2016-06-24 MED ORDER — DEXAMETHASONE 2 MG PO TABS: 2.0000 mg | ORAL_TABLET | Freq: Two times a day (BID) | ORAL | 0 refills | Status: DC

## 2016-06-24 MED ORDER — GADOBENATE DIMEGLUMINE 529 MG/ML IV SOLN
6.0000 mL | Freq: Once | INTRAVENOUS | Status: AC | PRN
  Administered 2016-06-24: 6 mL via INTRAVENOUS

## 2016-06-24 MED ORDER — IOPAMIDOL (ISOVUE-300) INJECTION 61%
INTRAVENOUS | Status: AC
  Filled 2016-06-24: qty 30

## 2016-06-24 MED ORDER — IOPAMIDOL (ISOVUE-300) INJECTION 61%
INTRAVENOUS | Status: AC
  Administered 2016-06-24: 75 mL
  Filled 2016-06-24: qty 75

## 2016-06-24 NOTE — Progress Notes (Signed)
Reason for Consult: Brain tumor Referring Physician: Sena Hitch Theresa Mckinney is an 76 y.o. female.  HPI: 76 year old female with a two-month history of progressive headaches and new onset of left upper extremity numbness and tingling. No history of trauma. No history of acute onset of symptoms but rather symptoms have been gradually progressive. Patient does have a history of renal cell carcinoma status post prior nephrectomy in 2011. She has no other history of malignancy. She has no recent weight loss or other constitutional symptoms. She is not aware of any weakness. She's had no seizures.  Past Medical History:  Diagnosis Date  . Arthritis   . Cancer ([place])   . Depression   . Hypertension     Past Surgical History:  Procedure Laterality Date  . ABDOMINAL HYSTERECTOMY    . NEPHRECTOMY      No family history on file.  Social History:  reports that she has never smoked. She has never used smokeless tobacco. She reports that she does not drink alcohol. Her drug history is not on file.  Allergies:  Allergies  Allergen Reactions  . Aristocort [Triamcinolone] Other (See Comments)    Skin felt like it was burning  . Ciprofloxacin Hcl Other (See Comments)    Paresthesia  . Codeine Nausea Only  . Demerol [Meperidine] Nausea Only  . Influenza Vaccine Live Swelling and Other (See Comments)    Arm swelling  . Macrolides And Ketolides Nausea Only  . Morphine And Related Nausea Only  . Other Other (See Comments)    Biphosphonates - Unknown  . Oxycodone Other (See Comments)    Severely pruritic rash  . Simvastatin Other (See Comments)    Dyspepsia   . Penicillins Rash  . Septra [Sulfamethoxazole-Trimethoprim] Rash    Medications: I have reviewed the patient's current medications.  Results for orders placed or performed during the hospital encounter of 06/23/16 (from the past 48 hour(s))  Basic metabolic panel     Status: Abnormal   Collection Time: 06/23/16 12:25 PM  Result Value  Ref Range   Sodium 138 135 - 145 mmol/L   Potassium 3.7 3.5 - 5.1 mmol/L   Chloride 103 101 - 111 mmol/L   CO2 25 22 - 32 mmol/L   Glucose, Bld 126 (H) 65 - 99 mg/dL   BUN 16 6 - 20 mg/dL   Creatinine, Ser 1.38 (H) 0.44 - 1.00 mg/dL   Calcium 9.8 8.9 - 10.3 mg/dL   GFR calc non Af Amer 36 (L) >60 mL/min   GFR calc Af Amer 42 (L) >60 mL/min    Comment: (NOTE) The eGFR has been calculated using the CKD EPI equation. This calculation has not been validated in all clinical situations. eGFR's persistently <60 mL/min signify possible Chronic Kidney Disease.    Anion gap 10 5 - 15  CBC     Status: None   Collection Time: 06/23/16 12:25 PM  Result Value Ref Range   WBC 6.1 4.0 - 10.5 K/uL   RBC 4.22 3.87 - 5.11 MIL/uL   Hemoglobin 13.8 12.0 - 15.0 g/dL   HCT 40.5 36.0 - 46.0 %   MCV 96.0 78.0 - 100.0 fL   MCH 32.7 26.0 - 34.0 pg   MCHC 34.1 30.0 - 36.0 g/dL   RDW 12.0 11.5 - 15.5 %   Platelets 163 150 - 400 K/uL  I-stat troponin, ED     Status: None   Collection Time: 06/23/16 12:32 PM  Result Value Ref Range  Troponin i, poc 0.00 0.00 - 0.08 ng/mL   Comment 3            Comment: Due to the release kinetics of cTnI, a negative result within the first hours of the onset of symptoms does not rule out myocardial infarction with certainty. If myocardial infarction is still suspected, repeat the test at appropriate intervals.   D-dimer, quantitative (not at [facility])     Status: None   Collection Time: 06/23/16  1:01 PM  Result Value Ref Range   D-Dimer, Quant 0.49 0.00 - 0.50 ug/mL-FEU    Comment: (NOTE) At the manufacturer cut-off of 0.50 ug/mL FEU, this assay has been documented to exclude PE with a sensitivity and negative predictive value of 97 to 99%.  At this time, this assay has not been approved by the FDA to exclude DVT/VTE. Results should be correlated with clinical presentation.   Urinalysis, Routine w reflex microscopic     Status: Abnormal   Collection Time:  06/23/16  1:40 PM  Result Value Ref Range   Color, Urine STRAW (A) YELLOW   APPearance CLEAR CLEAR   Specific Gravity, Urine 1.005 1.005 - 1.030   pH 7.0 5.0 - 8.0   Glucose, UA NEGATIVE NEGATIVE mg/dL   Hgb urine dipstick NEGATIVE NEGATIVE   Bilirubin Urine NEGATIVE NEGATIVE   Ketones, ur NEGATIVE NEGATIVE mg/dL   Protein, ur NEGATIVE NEGATIVE mg/dL   Nitrite NEGATIVE NEGATIVE   Leukocytes, UA NEGATIVE NEGATIVE  CBC     Status: None   Collection Time: 06/24/16  5:32 AM  Result Value Ref Range   WBC 4.5 4.0 - 10.5 K/uL   RBC 3.87 3.87 - 5.11 MIL/uL   Hemoglobin 12.6 12.0 - 15.0 g/dL   HCT 67.4 01.9 - 18.8 %   MCV 94.8 78.0 - 100.0 fL   MCH 32.6 26.0 - 34.0 pg   MCHC 34.3 30.0 - 36.0 g/dL   RDW 75.5 67.6 - 15.9 %   Platelets 161 150 - 400 K/uL  Basic metabolic panel     Status: Abnormal   Collection Time: 06/24/16  5:32 AM  Result Value Ref Range   Sodium 140 135 - 145 mmol/L   Potassium 3.9 3.5 - 5.1 mmol/L   Chloride 107 101 - 111 mmol/L   CO2 23 22 - 32 mmol/L   Glucose, Bld 136 (H) 65 - 99 mg/dL   BUN 17 6 - 20 mg/dL   Creatinine, Ser 1.84 (H) 0.44 - 1.00 mg/dL   Calcium 9.1 8.9 - 74.9 mg/dL   GFR calc non Af Amer 42 (L) >60 mL/min   GFR calc Af Amer 48 (L) >60 mL/min    Comment: (NOTE) The eGFR has been calculated using the CKD EPI equation. This calculation has not been validated in all clinical situations. eGFR's persistently <60 mL/min signify possible Chronic Kidney Disease.    Anion gap 10 5 - 15  Glucose, capillary     Status: Abnormal   Collection Time: 06/24/16  7:33 AM  Result Value Ref Range   Glucose-Capillary 125 (H) 65 - 99 mg/dL    Dg Chest 2 View  Result Date: 06/23/2016 CLINICAL DATA:  Acute chest pain. EXAM: CHEST  2 VIEW COMPARISON:  07/31/2013 and prior exams FINDINGS: The cardiomediastinal silhouette is unremarkable. There is no evidence of focal airspace disease, pulmonary edema, suspicious pulmonary nodule/mass, pleural effusion, or  pneumothorax. No acute bony abnormalities are identified. IMPRESSION: No active cardiopulmonary disease. Electronically Signed   By:  Harmon Pier M.D.   On: 06/23/2016 13:53   Ct Head Wo Contrast  Result Date: 06/23/2016 CLINICAL DATA:  Left-sided facial droop.  Left-sided weakness. EXAM: CT HEAD WITHOUT CONTRAST TECHNIQUE: Contiguous axial images were obtained from the base of the skull through the vertex without intravenous contrast. COMPARISON:  None. FINDINGS: Brain: A mass lesion is present in the right occipital lobe measuring 4.0 x 3.1 x 3.6 cm. There is marked surrounding vasogenic edema which extends into the right temporal lobe and posterior right parietal lobe. There is effacement of the sulci locally. The occipital horn of the right lateral ventricle is effaced. No other focal lesions are present. There is no evidence for ischemic infarct. The basal ganglia and insular ribbon are intact bilaterally. The cortex is otherwise within normal limits. Vascular: No hyperdense vessel or unexpected calcification. Skull: The calvarium is intact. No focal lytic or blastic lesions are present. Sinuses/Orbits: A fluid level is present in the right maxillary sinus. There is fluid in the right sphenoid sinus. Mild mucosal thickening is noted in the left maxillary sinus and scattered throughout the sphenoid sinuses. Mastoid air cells are clear bilaterally. The globes and orbits are within normal limits bilaterally. Other: 1. Right occipital mass measuring 4.0 x 3.1 x 3.6 cm IMPRESSION: 1. Right occipital mass measuring 4.0 x 3.1 x 3.6 cm with significant surrounding vasogenic edema. 2. Mass effect results in effacement of the adjacent sulci and occipital horn of the right lateral ventricle. 3. No other focal lesions. 4. No evidence for acute ischemic infarct. 5. Recommend MRI the brain without with contrast for further evaluation. If this is a solitary lesion, a primary brain tumor such as GBM is favored over  metastatic disease. 6. Acute and chronic sinus disease as described. Electronically Signed   By: Marin Roberts M.D.   On: 06/23/2016 16:16   Mr Laqueta Jean WS Contrast  Result Date: 06/24/2016 CLINICAL DATA:  Occipital headaches with mass seen on recent head CT. EXAM: MRI HEAD WITHOUT AND WITH CONTRAST TECHNIQUE: Multiplanar, multiecho pulse sequences of the brain and surrounding structures were obtained without and with intravenous contrast. CONTRAST:  43mL MULTIHANCE GADOBENATE DIMEGLUMINE 529 MG/ML IV SOLN COMPARISON:  Head CT 06/23/2016 FINDINGS: Brain: There is a mass centered in the right occipital lobe that measures 3.4 x 3.4 cm with surrounding hyperintense T2 weighted signal extending into the right parietal and temporal white matter. There is neighboring sulcal effacement but no midline shift or herniation. There is multifocal susceptibility within the lesion indicating prior hemorrhage. The mass heterogeneously enhances following contrast administration. On postcontrast images, the mass measures 3.8 x 2.7 x 3.4 cm. The midline structures of the brain are normal. There are no other lesions identified. No hydrocephalus. No age advanced or lobar predominant atrophy. Vascular: The major intracranial flow voids are preserved. Skull and upper cervical spine: Normal marrow signal. Sinuses/Orbits: Small amount of fluid in the right maxillary sinus. No mastoid or middle ear effusion. Normal orbits. Other: None IMPRESSION: 1. Heterogeneously contrast-enhancing mass centered within the right occipital lobe, with features most suggesting a high-grade glioma. Surrounding hyperintense T2 weighted signal may indicate nonenhancing tumor and/or vasogenic edema. 2. Evidence of prior petechial hemorrhage within the mass. 3. Effacement of sulci neighboring the mass, but no midline shift or herniation. Electronically Signed   By: Deatra Robinson M.D.   On: 06/24/2016 03:01    Pertinent items noted in HPI and remainder  of comprehensive ROS otherwise negative. Blood pressure 133/69, pulse 78,  temperature 98.6 F (37 C), temperature source Oral, resp. rate 15, height 5' (1.524 m), weight 60.1 kg (132 lb 9.6 oz), SpO2 95 %. Patient is awake and alert. She is oriented and appropriate. Speech is fluent. Her judgment and insight are intact. Cranial nerve function reveals a reasonably dense left-sided visual field deficit. Visual acuity reasonably intact. Extraocular movements are full. Mild left facial droop, central, tongue protrudes midline. Palate elevates to midline. Motor examination of the extremities reveals 5 over 5 strength without pronator drift. Sensory examination nonfocal. Deep tendon versus normal active. No evidence of long track signs.   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 I would like patient to have a CT scan of her chest and abdomen prior to discharge home. Patient is unclear as to whether she wishes to consider any type of surgical intervention. I recommend patient be placed on Decadron 2 mg by mouth twice a day and discharged home with follow-up in my office in 1 week. We will discuss treatment options further at that time.  Theresa Mckinney A 06/24/2016, 10:10 AM

## 2016-06-24 NOTE — Progress Notes (Signed)
Pt at MRI. Family member with pt.

## 2016-06-24 NOTE — Progress Notes (Signed)
   Subjective: No acute events overnight.  Headache and nausea have improved.  MRI performed in the early AM.  Still feeling overwhelmed by the the news, she is not sure she could tolerate radiation and surgery if they were offered as options.  Objective:  Vital signs in last 24 hours: Vitals:   06/23/16 1700 06/23/16 1744 06/23/16 1746 06/23/16 2200  BP: (!) 154/70  (!) 147/59 138/65  Pulse: 78  79   Resp: 12  15   Temp:   97.9 F (36.6 C)   TempSrc:   Oral   SpO2: 97%  98%   Weight:  132 lb 9.6 oz (60.1 kg)    Height:  5' (1.524 m)     Physical Exam  Constitutional: She is oriented to person, place, and time. She appears well-developed and well-nourished. No distress.  Cardiovascular: Normal rate, regular rhythm and normal heart sounds.   Pulmonary/Chest: Effort normal and breath sounds normal.  Neurological: She is alert and oriented to person, place, and time.  Left facial droop  Psychiatric: She has a normal mood and affect. Her behavior is normal.   CBC Latest Ref Rng & Units 06/24/2016 06/23/2016 09/22/2009  WBC 4.0 - 10.5 K/uL 4.5 6.1 10.2  Hemoglobin 12.0 - 15.0 g/dL 12.6 13.8 11.7(L)  Hematocrit 36.0 - 46.0 % 36.7 40.5 33.2(L)  Platelets 150 - 400 K/uL 161 163 195   CMP Latest Ref Rng & Units 06/24/2016 06/23/2016 09/22/2009  Glucose 65 - 99 mg/dL 136(H) 126(H) 186(H)  BUN 6 - 20 mg/dL 17 16 12   Creatinine 0.44 - 1.00 mg/dL 1.22(H) 1.38(H) 1.33(H)  Sodium 135 - 145 mmol/L 140 138 136  Potassium 3.5 - 5.1 mmol/L 3.9 3.7 5.0 DELTA CHECK NOTED  Chloride 101 - 111 mmol/L 107 103 105  CO2 22 - 32 mmol/L 23 25 23   Calcium 8.9 - 10.3 mg/dL 9.1 9.8 8.6   MRI Brain w/ and w/o Contrast 06/24/2016 IMPRESSION: 1. Heterogeneously contrast-enhancing mass centered within the right occipital lobe, with features most suggesting a high-grade glioma. Surrounding hyperintense T2 weighted signal may indicate nonenhancing tumor and/or vasogenic edema. 2. Evidence of prior petechial  hemorrhage within the mass. 3. Effacement of sulci neighboring the mass, but no midline shift or herniation.  Assessment/Plan:  Principal Problem:   Brain mass Active Problems:   Chest pain   Essential hypertension   76 y.o. 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  She is hemodynamically stable with mild visual disturbances and left arm parasthesias.  Awaiting inpatient neurosurgery consultation, likely discharge home with steroids once follow-up is arranged.  #Right Occipital Tumor -MRI w/ and w/o contrast -dexamethasone 10 mg IV Q6H -compazine PRN -neurosurgery consult  #Chest Pain Resolved. EKG , troponin undetectable, normal EKG, HEART score 3 (low risk).  No further work-up at this time. -Monitor for symptoms  #HTN Normotensive.  On hydralazine and nebivolol. -Hold antihypertensives -Monitor BPs  #Anxiety -Continue home tranxene BID  Fluids: none Diet: heart DVT Prophylaxis: SCDs Code Status: full  Dispo: Anticipated discharge in approximately 0-1 day(s).   Minus Liberty, MD 06/24/2016, 6:48 AM Pager: [phone]

## 2016-06-24 NOTE — Discharge Summary (Signed)
Name: Theresa Mckinney MRN: [id number] DOB: [birth year] 76 y.o. PCP: Provider Not In System  Date of Admission: 06/23/2016 12:25 PM Date of Discharge: 06/24/2016 Attending Physician: Theresa Filler, MD  Discharge Diagnosis:  Principal Problem:   Brain mass Active Problems:   Chest pain   Essential hypertension   Discharge Medications: Allergies as of 06/24/2016      Reactions   Aristocort [triamcinolone] Other (See Comments)   Skin felt like it was burning   Ciprofloxacin Hcl Other (See Comments)   Paresthesia   Codeine Nausea Only   Demerol [meperidine] Nausea Only   Influenza Vaccine Live Swelling, Other (See Comments)   Arm swelling   Macrolides And Ketolides Nausea Only   Morphine And Related Nausea Only   Other Other (See Comments)   Biphosphonates - Unknown   Oxycodone Other (See Comments)   Severely pruritic rash   Simvastatin Other (See Comments)   Dyspepsia    Penicillins Rash   Septra [sulfamethoxazole-trimethoprim] Rash      Medication List    TAKE these medications   acetaminophen 500 MG tablet Commonly known as:  TYLENOL Take 1,000 mg by mouth every 6 (six) hours as needed for moderate pain or headache.   cholecalciferol 1000 units tablet Commonly known as:  VITAMIN D Take 2,000 Units by mouth daily.   clorazepate 7.5 MG tablet Commonly known as:  TRANXENE Take 7.5 mg by mouth 2 (two) times daily as needed for anxiety.   dexamethasone 2 MG tablet Commonly known as:  DECADRON Take 1 tablet (2 mg total) by mouth 2 (two) times daily.   Flax Seed Oil 1000 MG Caps Take 2,000 mg by mouth 2 (two) times daily.   hydrALAZINE 25 MG tablet Commonly known as:  APRESOLINE Take 25 mg by mouth 2 (two) times daily.   loratadine 10 MG tablet Commonly known as:  CLARITIN Take 10 mg by mouth daily.   nebivolol 5 MG tablet Commonly known as:  BYSTOLIC Take 5 mg by mouth daily.       Disposition and follow-up:   Theresa Mckinney was discharged  from [facility] in Mckinney condition.  At the hospital follow up visit please address:  1.  Brain Mass.  New brain mass.  Follow-up with neurosurgery.  Monitor for symptoms of seizure and hemorrhage.  2.  Labs / imaging needed at time of follow-up: none  3.  Pending labs/ test needing follow-up: none  Follow-up Appointments: Follow-up Information    Theresa A, MD. Schedule an appointment as soon as possible for Mckinney visit in 1 week(s).   Specialty:  Neurosurgery Why:  Ask for Theresa Mckinney information: [address] [place] [address] [phone]           Hospital Course by problem list: Principal Problem:   Brain mass Active Problems:   Chest pain   Essential hypertension   1. Brain Mass Theresa Mckinney is Mckinney 76 year old woman with history of RCC (s/p left nephrectomy 2012) presented to the ED with intermittent left arm numbness and parasthesias for Mckinney few days, and new chest pain on the day of admission.  She was ruled out for MI with normal EKG and undetectable troponin, but head CT was obtained when family noted Mckinney new subtle left facial droop.  CT showed R occipital mass, and she was admitted for further evaluation.  She noted recent subtle visual changes and headache but no diplopia, no falls, loss  of consciousness, change in functional status, weight loss, or other symptoms.  MRI with contrast further described Mckinney solitary right occipital mass most concerning for high grade glioma.  CT of chest, abdomen, and pelvis revealed no signs of malignancy.  She was started on dexamethasone for cerebral edema, headache, and nausea and improved symptomatically.  She was evaluated by neurosurgeon Dr Theresa Mckinney and discharged home with close follow-up with him.  Discharge Vitals:   BP 133/69 (BP Location: Left Arm)   Pulse 78   Temp 98.6 F (37 C) (Oral)   Resp 15   Ht 5' (1.524 m)   Wt 132 lb 9.6 oz (60.1 kg)   SpO2 95%   BMI 25.90 kg/m   Pertinent Labs,  Studies, and Procedures:   MRI Brain w/ and w/o Contrast 06/24/2016 IMPRESSION: 1. Heterogeneously contrast-enhancing mass centered within the right occipital lobe, with features most suggesting Mckinney high-grade glioma. Surrounding hyperintense T2 weighted signal may indicate nonenhancing tumor and/or vasogenic edema. 2. Evidence of prior petechial hemorrhage within the mass. 3. Effacement of sulci neighboring the mass, but no midline shift or herniation.  CT Chest, Abdoman, and Pelvis w/o Contrast 06/24/2016 IMPRESSION: 1. No evidence of primary malignancy within the chest, abdomen, or pelvis. 2. Left nephrectomy, without recurrent or metastatic disease. 3.  Coronary artery atherosclerosis. Aortic atherosclerosis. 4.  Tiny hiatal hernia.  Discharge Instructions: Discharge Instructions    Diet - low sodium heart healthy    Complete by:  As directed    Increase activity slowly    Complete by:  As directed      You were admitted to the hospital because of left arm numbness and tingling.  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  I have prescribed you Mckinney steroid pill, dexamethasone, to take twice per day to help with headaches, nausea, and inflammation.  Please call Dr. Marchelle Mckinney office first thing on Monday to schedule an appointment this week.  You should also call your PCP to schedule and appointment in about 2 weeks.  If you have worsening headaches, loss of consciousness, double vision, falls, or other new symptoms, please come back to the ED.  Signed: Minus Liberty, MD 06/24/2016, 10:44 AM   Pager: [phone]

## 2016-06-24 NOTE — Care Management Note (Signed)
Case Management Note  Patient Details  Name: MARGHERITA COLLYER MRN: [id number] Date of Birth: [birth year]  Subjective/Objective: Pt admitted with brain tumor                    Action/Plan:   PTA completely independent from home alone.  Pt has PCP and denied barriers to obtaining prescription medications.  CM will continue to follow for discharge needs   Expected Discharge Date:                  Expected Discharge Plan:  Home/Self Care  In-House Referral:     Discharge planning Services  CM Consult  Post Acute Care Choice:    Choice offered to:     DME Arranged:    DME Agency:     HH Arranged:    HH Agency:     Status of Service:  In process, will continue to follow  If discussed at Long Length of Stay Meetings, dates discussed:    Additional Comments:  Maryclare Labrador, RN 06/24/2016, 10:14 AM

## 2016-06-24 NOTE — Evaluation (Addendum)
Occupational Therapy Evaluation/Discharge Patient Details Name: Theresa Mckinney MRN: [id number] DOB: 1940-06-20 Today's Date: 06/24/2016    History of Present Illness 76 y.o. female with history of HTN and RCC (s/p L nephrectomy 2011) who presents with intermittent left arm numbness and parasthesias, left facial droop, and headaches. 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     Clinical Impression   PTA, pt was independent with ADL and functional mobility and driving during the day. She does have family check in on her nearly daily. Pt currently able to participate in all basic ADL tasks with modified independence in hospital setting. She did require VC's for assistance with math for financial management tasks. Educated pt and family concerning need for intermittent supervision at home and they verbalized understanding. Additionally educated on protecting L UE during cooking tasks and she demonstrates understanding. No further acute OT services necessary. OT will sign off.    Follow Up Recommendations  No OT follow up;Supervision - Intermittent    Equipment Recommendations  None recommended by OT    Recommendations for Other Services       Precautions / Restrictions Precautions Precautions: None Restrictions Weight Bearing Restrictions: No      Mobility Bed Mobility Overal bed mobility: Independent                Transfers Overall transfer level: Independent Equipment used: None                  Balance Overall balance assessment: No apparent balance deficits (not formally assessed)                                         ADL either performed or assessed with clinical judgement   ADL Overall ADL's : Modified independent                                       General ADL Comments: Able to complete all basic ADL tasks with increased time. Required  assistance with mathematics for financial management and recommend supervision for safety with IADL.     Vision Baseline Vision/History: Wears glasses Wears Glasses: At all times Patient Visual Report:  (Has to follow along with finger) Additional Comments: Pt able to read but required multiple attempts. Noted decreased visual attention and pt already utilizing compensatory methods for reading such as isolating lines with her bookmark.     Perception     Praxis Praxis Praxis tested?: Within functional limits    Pertinent Vitals/Pain Pain Assessment: No/denies pain     Hand Dominance Right   Extremity/Trunk Assessment Upper Extremity Assessment Upper Extremity Assessment: LUE deficits/detail LUE Deficits / Details: reports tingling/pins and needles in L UE that is resolving with medication.   Lower Extremity Assessment Lower Extremity Assessment: Overall WFL for tasks assessed   Cervical / Trunk Assessment Cervical / Trunk Assessment: Normal   Communication Communication Communication: No difficulties   Cognition Arousal/Alertness: Awake/alert Behavior During Therapy: Flat affect Overall Cognitive Status: Within Functional Limits for tasks assessed                                     General Comments  Exercises     Shoulder Instructions      Home Living Family/patient expects to be discharged to:: Private residence Living Arrangements: Alone Available Help at Discharge: Family;Available PRN/intermittently Type of Home: House Home Access: Stairs to enter [employer] of Steps: 2 Entrance Stairs-Rails: None Home Layout: One level     Bathroom Shower/Tub: Teacher, early years/pre: Standard Bathroom Accessibility: Yes   Home Equipment: None          Prior Functioning/Environment Level of Independence: Independent        Comments: Drives during the day. Independent with housekeeping and cooking, primarily uses  microwave.        OT Problem List: Impaired sensation      OT Treatment/Interventions:      OT Goals(Current goals can be found in the care plan section) Acute Rehab OT Goals Patient Stated Goal: home OT Goal Formulation: With patient/family Time For Goal Achievement: 07/08/16 Potential to Achieve Goals: Good  OT Frequency:     Barriers to D/C:            Co-evaluation PT/OT/SLP Co-Evaluation/Treatment: Yes Reason for Co-Treatment: Complexity of the patient's impairments (multi-system involvement);For patient/therapist safety PT goals addressed during session: Mobility/safety with mobility;Balance OT goals addressed during session: ADL's and self-care      End of Session Nurse Communication: Mobility status  Activity Tolerance: Patient tolerated treatment well Patient left: in bed;with call bell/phone within reach;with family/visitor present (sitting at EOB)  OT Visit Diagnosis: Unsteadiness on feet (R26.81)                Time: 8588-5027 OT Time Calculation (min): 16 min Charges:  OT General Charges $OT Visit: 1 Procedure OT Evaluation $OT Eval Moderate Complexity: 1 Procedure G-Codes: OT G-codes **NOT FOR INPATIENT CLASS** Functional Assessment Tool Used: AM-PAC 6 Clicks Daily Activity Functional Limitation: Self care Self Care Current Status (X4128): 0 percent impaired, limited or restricted Self Care Goal Status (N8676): 0 percent impaired, limited or restricted Self Care Discharge Status (H2094): 0 percent impaired, limited or restricted   Norman Herrlich, MS OTR/L  Pager: Theresa Mckinney 06/24/2016, 12:14 PM

## 2016-06-24 NOTE — Discharge Instructions (Addendum)
You were admitted to the hospital because of left arm numbness and tingling.  We found that this is due to what is most likely a tumor in your brain.  I have prescribed you a steroid pill, dexamethasone, to take twice per day to help with headaches, nausea, and inflammation.  Please call Dr. Marchelle Folks office first thing on Monday to schedule an appointment this week.  You should also call your PCP to schedule and appointment in about 2 weeks.  If you have worsening headaches, loss of consciousness, double vision, falls, or other new symptoms, please come back to the ED.    Nonspecific Chest Pain Chest pain can be caused by many different conditions. There is always a chance that your pain could be related to something serious, such as a heart attack or a blood clot in your lungs. Chest pain can also be caused by conditions that are not life-threatening. If you have chest pain, it is very important to follow up with your health care provider. What are the causes? Causes of this condition include:  Heartburn.  Pneumonia or bronchitis.  Anxiety or stress.  Inflammation around your heart (pericarditis) or lung (pleuritis or pleurisy).  A blood clot in your lung.  A collapsed lung (pneumothorax). This can develop suddenly on its own (spontaneous pneumothorax) or from trauma to the chest.  Shingles infection (varicella-zoster virus).  Heart attack.  Damage to the bones, muscles, and cartilage that make up your chest wall. This can include:  Bruised bones due to injury.  Strained muscles or cartilage due to frequent or repeated coughing or overwork.  Fracture to one or more ribs.  Sore cartilage due to inflammation (costochondritis). What increases the risk? Risk factors for this condition may include:  Activities that increase your risk for trauma or injury to your chest.  Respiratory infections or conditions that cause frequent coughing.  Medical conditions or overeating that  can cause heartburn.  Heart disease or family history of heart disease.  Conditions or health behaviors that increase your risk of developing a blood clot.  Having had chicken pox (varicella zoster). What are the signs or symptoms? Chest pain can feel like:  Burning or tingling on the surface of your chest or deep in your chest.  Crushing, pressure, aching, or squeezing pain.  Dull or sharp pain that is worse when you move, cough, or take a deep breath.  Pain that is also felt in your back, neck, shoulder, or arm, or pain that spreads to any of these areas. Your chest pain may come and go, or it may stay constant. How is this diagnosed? Lab tests or other studies may be needed to find the cause of your pain. Your health care provider may have you take a test called an ECG (electrocardiogram). An ECG records your heartbeat patterns at the time the test is performed. You may also have other tests, such as:  Transthoracic echocardiogram (TTE). In this test, sound waves are used to create a picture of the heart structures and to look at how blood flows through your heart.  Transesophageal echocardiogram (TEE).This is a more advanced imaging test that takes images from inside your body. It allows your health care provider to see your heart in finer detail.  Cardiac monitoring. This allows your health care provider to monitor your heart rate and rhythm in real time.  Holter monitor. This is a portable device that records your heartbeat and can help to diagnose abnormal heartbeats. It allows your  health care provider to track your heart activity for several days, if needed.  Stress tests. These can be done through exercise or by taking medicine that makes your heart beat more quickly.  Blood tests.  Other imaging tests. How is this treated? Treatment depends on what is causing your chest pain. Treatment may include:  Medicines. These may include:  Acid blockers for  heartburn.  Anti-inflammatory medicine.  Pain medicine for inflammatory conditions.  Antibiotic medicine, if an infection is present.  Medicines to dissolve blood clots.  Medicines to treat coronary artery disease (CAD).  Supportive care for conditions that do not require medicines. This may include:  Resting.  Applying heat or cold packs to injured areas.  Limiting activities until pain decreases. Follow these instructions at home: Medicines   If you were prescribed an antibiotic, take it as told by your health care provider. Do not stop taking the antibiotic even if you start to feel better.  Take over-the-counter and prescription medicines only as told by your health care provider. Lifestyle   Do not use any products that contain nicotine or tobacco, such as cigarettes and e-cigarettes. If you need help quitting, ask your health care provider.  Do not drink alcohol.  Make lifestyle changes as directed by your health care provider. These may include:  Getting regular exercise. Ask your health care provider to suggest some activities that are safe for you.  Eating a heart-healthy diet. A registered dietitian can help you to learn healthy eating options.  Maintaining a healthy weight.  Managing diabetes, if necessary.  Reducing stress, such as with yoga or relaxation techniques. General instructions   Avoid any activities that bring on chest pain.  If heartburn is the cause for your chest pain, raise (elevate) the head of your bed about 6 inches (15 cm) by putting blocks under the legs. Sleeping with more pillows does not effectively relieve heartburn because it only changes the position of your head.  Keep all follow-up visits as told by your health care provider. This is important. This includes any further testing if your chest pain does not go away. Contact a health care provider if:  Your chest pain does not go away.  You have a rash with blisters on your  chest.  You have a fever.  You have chills. Get help right away if:  Your chest pain is worse.  You have a cough that gets worse, or you cough up blood.  You have severe pain in your abdomen.  You have severe weakness.  You faint.  You have sudden, unexplained chest discomfort.  You have sudden, unexplained discomfort in your arms, back, neck, or jaw.  You have shortness of breath at any time.  You suddenly start to sweat, or your skin gets clammy.  You feel nauseous or you vomit.  You suddenly feel light-headed or dizzy.  Your heart begins to beat quickly, or it feels like it is skipping beats. These symptoms may represent a serious problem that is an emergency. Do not wait to see if the symptoms will go away. Get medical help right away. Call your local emergency services (911 in the [place] Do not drive yourself to the hospital. This information is not intended to replace advice given to you by your health care provider. Make sure you discuss any questions you have with your health care provider. Document Released: 12/06/2004 Document Revised: 11/21/2015 Document Reviewed: 11/21/2015 Elsevier Interactive Patient Education  2017 [employer].

## 2016-06-24 NOTE — Care Management Obs Status (Signed)
[facility] NOTIFICATION   Patient Details  Name: Theresa Mckinney MRN: [id number] Date of Birth: 24-May-1940   Medicare Observation Status Notification Given:  Yes    Maryclare Labrador, RN 06/24/2016, 10:16 AM

## 2016-06-24 NOTE — Evaluation (Signed)
Physical Therapy Evaluation Patient Details Name: Theresa Mckinney MRN: [id number] DOB: [birth year] Today's Date: 06/24/2016   History of Present Illness  76 y.o. female with history of HTN and RCC (s/p L nephrectomy 2011) who presents with intermittent left arm numbness and parasthesias, left facial droop, and headaches. 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    Clinical Impression  PT eval complete. Pt is independent with all functional mobility. See below for full details. No further PT intervention indicated. No f/u services or DME needed. PT signing off.    Follow Up Recommendations No PT follow up    Equipment Recommendations  None recommended by PT    Recommendations for Other Services       Precautions / Restrictions Precautions Precautions: None      Mobility  Bed Mobility Overal bed mobility: Independent                Transfers Overall transfer level: Independent Equipment used: None                Ambulation/Gait Ambulation/Gait assistance: Modified independent (Device/Increase time) Ambulation Distance (Feet): 350 Feet Assistive device: None Gait Pattern/deviations: WFL(Within Functional Limits) Gait velocity: decreased Gait velocity interpretation: Below normal speed for age/gender General Gait Details: slow, steady gait  Stairs Stairs: Yes Stairs assistance: Supervision Stair Management: No rails;Forwards;Alternating pattern Number of Stairs: 4    Wheelchair Mobility    Modified Rankin (Stroke Patients Only)       Balance Overall balance assessment: No apparent balance deficits (not formally assessed)                                           Pertinent Vitals/Pain Pain Assessment: No/denies pain    Home Living Family/patient expects to be discharged to:: Private residence Living Arrangements: Alone Available Help at Discharge:  Family;Available PRN/intermittently Type of Home: House Home Access: Stairs to enter Entrance Stairs-Rails: None Entrance Stairs-Number of Steps: 2 Home Layout: One level Home Equipment: None      Prior Function Level of Independence: Independent         Comments: Drives during the day. Independent with housekeeping and cooking, primarily uses microwave.     Hand Dominance        Extremity/Trunk Assessment   Upper Extremity Assessment Upper Extremity Assessment: Defer to OT evaluation    Lower Extremity Assessment Lower Extremity Assessment: Overall WFL for tasks assessed    Cervical / Trunk Assessment Cervical / Trunk Assessment: Normal  Communication   Communication: No difficulties  Cognition Arousal/Alertness: Awake/alert Behavior During Therapy: Flat affect Overall Cognitive Status: Within Functional Limits for tasks assessed                                        General Comments      Exercises     Assessment/Plan    PT Assessment Patent does not need any further PT services  PT Problem List         PT Treatment Interventions      PT Goals (Current goals can be found in the Care Plan section)  Acute Rehab PT Goals Patient Stated Goal: home PT Goal Formulation: All assessment and education complete, DC therapy    Frequency  Barriers to discharge        Co-evaluation PT/OT/SLP Co-Evaluation/Treatment: Yes Reason for Co-Treatment: Necessary to address cognition/behavior during functional activity;Complexity of the patient's impairments (multi-system involvement) PT goals addressed during session: Mobility/safety with mobility;Balance         End of Session Equipment Utilized During Treatment: Gait belt Activity Tolerance: Patient tolerated treatment well Patient left: in bed;with call bell/phone within reach;with family/visitor present Nurse Communication: Mobility status PT Visit Diagnosis: Other abnormalities of  gait and mobility (R26.89)    Time: 7530-0511 PT Time Calculation (min) (ACUTE ONLY): 19 min   Charges:   PT Evaluation $PT Eval Low Complexity: 1 Procedure     PT G Codes:   PT G-Codes **NOT FOR INPATIENT CLASS** Functional Assessment Tool Used: AM-PAC 6 Clicks Basic Mobility Functional Limitation: Mobility: Walking and moving around Mobility: Walking and Moving Around Current Status (M2111): 0 percent impaired, limited or restricted Mobility: Walking and Moving Around Goal Status (N3567): 0 percent impaired, limited or restricted Mobility: Walking and Moving Around Discharge Status (O1410): 0 percent impaired, limited or restricted    Lorrin Goodell, PT  Office # [phone] Pager [phone]   Lorriane Shire 06/24/2016, 11:38 AM

## 2016-08-20 ENCOUNTER — Emergency Department (HOSPITAL_COMMUNITY): Payer: Medicare Other

## 2016-08-20 ENCOUNTER — Encounter (HOSPITAL_COMMUNITY): Payer: Self-pay | Admitting: Emergency Medicine

## 2016-08-20 ENCOUNTER — Inpatient Hospital Stay (HOSPITAL_COMMUNITY)
Admission: EM | Admit: 2016-08-20 | Discharge: 2016-08-23 | DRG: 102 | Disposition: A | Discharge status: Hospice | Payer: Medicare Other | Attending: Internal Medicine | Admitting: Internal Medicine

## 2016-08-20 DIAGNOSIS — G932 Benign intracranial hypertension: Secondary | ICD-10-CM | POA: Diagnosis not present

## 2016-08-20 DIAGNOSIS — G9389 Other specified disorders of brain: Secondary | ICD-10-CM | POA: Diagnosis present

## 2016-08-20 DIAGNOSIS — R059 Cough, unspecified: Secondary | ICD-10-CM

## 2016-08-20 DIAGNOSIS — Z79899 Other long term (current) drug therapy: Secondary | ICD-10-CM

## 2016-08-20 DIAGNOSIS — Z905 Acquired absence of kidney: Secondary | ICD-10-CM

## 2016-08-20 DIAGNOSIS — G919 Hydrocephalus, unspecified: Secondary | ICD-10-CM | POA: Diagnosis present

## 2016-08-20 DIAGNOSIS — Z90710 Acquired absence of both cervix and uterus: Secondary | ICD-10-CM

## 2016-08-20 DIAGNOSIS — W19XXXA Unspecified fall, initial encounter: Secondary | ICD-10-CM | POA: Diagnosis present

## 2016-08-20 DIAGNOSIS — I10 Essential (primary) hypertension: Secondary | ICD-10-CM | POA: Diagnosis present

## 2016-08-20 DIAGNOSIS — R05 Cough: Secondary | ICD-10-CM

## 2016-08-20 DIAGNOSIS — G936 Cerebral edema: Secondary | ICD-10-CM | POA: Diagnosis present

## 2016-08-20 DIAGNOSIS — C719 Malignant neoplasm of brain, unspecified: Secondary | ICD-10-CM | POA: Diagnosis present

## 2016-08-20 DIAGNOSIS — G9341 Metabolic encephalopathy: Secondary | ICD-10-CM | POA: Diagnosis present

## 2016-08-20 DIAGNOSIS — Z515 Encounter for palliative care: Secondary | ICD-10-CM

## 2016-08-20 DIAGNOSIS — Z880 Allergy status to penicillin: Secondary | ICD-10-CM

## 2016-08-20 DIAGNOSIS — G939 Disorder of brain, unspecified: Secondary | ICD-10-CM

## 2016-08-20 DIAGNOSIS — Z7189 Other specified counseling: Secondary | ICD-10-CM

## 2016-08-20 DIAGNOSIS — Y92009 Unspecified place in unspecified non-institutional (private) residence as the place of occurrence of the external cause: Secondary | ICD-10-CM

## 2016-08-20 DIAGNOSIS — F419 Anxiety disorder, unspecified: Secondary | ICD-10-CM | POA: Diagnosis present

## 2016-08-20 DIAGNOSIS — Z7951 Long term (current) use of inhaled steroids: Secondary | ICD-10-CM

## 2016-08-20 DIAGNOSIS — R4020 Unspecified coma: Secondary | ICD-10-CM | POA: Diagnosis present

## 2016-08-20 DIAGNOSIS — Z888 Allergy status to other drugs, medicaments and biological substances status: Secondary | ICD-10-CM

## 2016-08-20 DIAGNOSIS — Z66 Do not resuscitate: Secondary | ICD-10-CM | POA: Diagnosis present

## 2016-08-20 DIAGNOSIS — Z881 Allergy status to other antibiotic agents status: Secondary | ICD-10-CM

## 2016-08-20 DIAGNOSIS — G9340 Encephalopathy, unspecified: Secondary | ICD-10-CM

## 2016-08-20 DIAGNOSIS — F329 Major depressive disorder, single episode, unspecified: Secondary | ICD-10-CM | POA: Diagnosis present

## 2016-08-20 DIAGNOSIS — M1990 Unspecified osteoarthritis, unspecified site: Secondary | ICD-10-CM | POA: Diagnosis present

## 2016-08-20 DIAGNOSIS — R4182 Altered mental status, unspecified: Secondary | ICD-10-CM | POA: Diagnosis not present

## 2016-08-20 DIAGNOSIS — Z882 Allergy status to sulfonamides status: Secondary | ICD-10-CM

## 2016-08-20 DIAGNOSIS — J189 Pneumonia, unspecified organism: Secondary | ICD-10-CM | POA: Diagnosis present

## 2016-08-20 DIAGNOSIS — Z885 Allergy status to narcotic agent status: Secondary | ICD-10-CM

## 2016-08-20 LAB — CBC WITH DIFFERENTIAL/PLATELET
BASOS ABS: 0 10*3/uL (ref 0.0–0.1)
BASOS PCT: 0 %
EOS ABS: 0 10*3/uL (ref 0.0–0.7)
Eosinophils Relative: 0 %
HEMATOCRIT: 39 % (ref 36.0–46.0)
HEMOGLOBIN: 13.4 g/dL (ref 12.0–15.0)
Lymphocytes Relative: 10 %
Lymphs Abs: 1.2 10*3/uL (ref 0.7–4.0)
MCH: 33.2 pg (ref 26.0–34.0)
MCHC: 34.4 g/dL (ref 30.0–36.0)
MCV: 96.5 fL (ref 78.0–100.0)
Monocytes Absolute: 0.9 10*3/uL (ref 0.1–1.0)
Monocytes Relative: 7 %
NEUTROS ABS: 9.5 10*3/uL — AB (ref 1.7–7.7)
NEUTROS PCT: 82 %
Platelets: 140 10*3/uL — ABNORMAL LOW (ref 150–400)
RBC: 4.04 MIL/uL (ref 3.87–5.11)
RDW: 12.6 % (ref 11.5–15.5)
WBC: 11.5 10*3/uL — AB (ref 4.0–10.5)

## 2016-08-20 LAB — BASIC METABOLIC PANEL
ANION GAP: 6 (ref 5–15)
BUN: 36 mg/dL — ABNORMAL HIGH (ref 6–20)
CALCIUM: 9 mg/dL (ref 8.9–10.3)
CO2: 27 mmol/L (ref 22–32)
Chloride: 104 mmol/L (ref 101–111)
Creatinine, Ser: 1.23 mg/dL — ABNORMAL HIGH (ref 0.44–1.00)
GFR, EST AFRICAN AMERICAN: 48 mL/min — AB (ref >60)
GFR, EST NON AFRICAN AMERICAN: 41 mL/min — AB (ref >60)
Glucose, Bld: 134 mg/dL — ABNORMAL HIGH (ref 65–99)
Potassium: 4.3 mmol/L (ref 3.5–5.1)
SODIUM: 137 mmol/L (ref 135–145)

## 2016-08-20 MED ORDER — SODIUM CHLORIDE 0.9 % IV SOLN
INTRAVENOUS | Status: DC
  Administered 2016-08-20: 22:00:00 via INTRAVENOUS

## 2016-08-20 MED ORDER — DEXAMETHASONE 4 MG PO TABS: 2.0000 mg | ORAL_TABLET | Freq: Two times a day (BID) | ORAL | Status: DC

## 2016-08-20 MED ORDER — DEXTROSE 5 % IV SOLN
500.0000 mg | INTRAVENOUS | Status: DC
  Administered 2016-08-20: 500 mg via INTRAVENOUS
  Filled 2016-08-20 (×2): qty 500

## 2016-08-20 MED ORDER — ONDANSETRON 4 MG PO TBDP
8.0000 mg | ORAL_TABLET | ORAL | Status: DC | PRN
  Administered 2016-08-20: 8 mg via ORAL
  Filled 2016-08-20: qty 1

## 2016-08-20 MED ORDER — HYDRALAZINE HCL 25 MG PO TABS
25.0000 mg | ORAL_TABLET | Freq: Two times a day (BID) | ORAL | Status: DC
  Filled 2016-08-20: qty 1

## 2016-08-20 MED ORDER — DEXTROSE 5 % IV SOLN
2.0000 g | Freq: Three times a day (TID) | INTRAVENOUS | Status: DC
  Administered 2016-08-20 – 2016-08-21 (×2): 2 g via INTRAVENOUS
  Filled 2016-08-20 (×3): qty 2

## 2016-08-20 MED ORDER — TRAMADOL HCL 50 MG PO TABS
50.0000 mg | ORAL_TABLET | Freq: Once | ORAL | Status: AC
Start: 2016-08-20 — End: 2016-08-20
  Administered 2016-08-20: 50 mg via ORAL
  Filled 2016-08-20: qty 1

## 2016-08-20 MED ORDER — ACETAMINOPHEN 500 MG PO TABS: 1000.0000 mg | ORAL_TABLET | Freq: Four times a day (QID) | ORAL | Status: DC | PRN

## 2016-08-20 MED ORDER — CLORAZEPATE DIPOTASSIUM 7.5 MG PO TABS: 3.7500 mg | ORAL_TABLET | Freq: Two times a day (BID) | ORAL | Status: DC

## 2016-08-20 MED ORDER — NEBIVOLOL HCL 5 MG PO TABS
5.0000 mg | ORAL_TABLET | Freq: Every day | ORAL | Status: DC
  Filled 2016-08-20: qty 1

## 2016-08-20 NOTE — Clinical Social Work Note (Signed)
Clinical Social Work Assessment  Patient Details  Name: Theresa Mckinney MRN: [id number] Date of Birth: March 06, 1941  Date of referral:  08/20/16               Reason for consult:  Facility Placement                Permission sought to share information with:    Permission granted to share information::  Yes, Verbal Permission Granted  Name::        Agency::     Relationship::     Contact Information:     Housing/Transportation Living arrangements for the past 2 months:  Single Family Home Source of Information:  Adult Children (Sister) Patient Interpreter Needed:  None Criminal Activity/Legal Involvement Pertinent to Current Situation/Hospitalization:    Significant Relationships:  Adult Children (Sister) Lives with:  Adult Children Do you feel safe going back to the place where you live?  No Need for family participation in patient care:  Yes (Comment)  Care giving concerns:  Pt unresponsive at this time.  Pt' son Theresa Mckinney at ph: [phone] and pt's sister Theresa Mckinney at ph: [phone] stated a desire for the pt to be admitted for palliative care consult to be completed and possible entry into inpatient palliative care unit.  Pt's family is NOT ready for pt to be sent to inpatient or outpatient hospice at this time, but view this as a possible option in the future.  Social Worker assessment / plan:  CSW met with pt's son and sister and confirmed pt's plan to be admitted for palliative care if Palliative Care Consult recommends.  CSW provided active listening and validated pt's concerns. Pt has been living with family prior to being admitted to [facility].  Employment status:  Retired Nurse, adult PT Recommendations:  Not assessed at this time Information / Referral to community resources:     Patient/Family's Response to care:  Patient not alert and oriented.  Patient's son and sister agreeable to plan.  Pt's son and sister supportive and strongly involved  in pt.'s care.  Pt.'s sister and son tearful, but pleasant and appreciated CSW intervention.     Patient/Family's Understanding of and Emotional Response to Diagnosis, Current Treatment, and Prognosis:  Still assessing   Emotional Assessment Appearance:  Appears stated age Attitude/Demeanor/Rapport:    Affect (typically observed):  Unable to Assess Orientation:   (Pt unresponsive) Alcohol / Substance use:    Psych involvement (Current and /or in the community):     Discharge Needs  Concerns to be addressed:   (Pt's family desires palliative care, inpatient) Readmission within the last 30 days:  No Current discharge risk:  None Barriers to Discharge:  No Barriers Identified   Claudine Mouton, LCSWA 08/20/2016, 10:44 PM

## 2016-08-20 NOTE — Progress Notes (Signed)
[facility] Liaison:  RN visit  Met with patient/family at bedside in the Emergency Department.   Spoke with sister, Sonia Baller, who advised that they do not want to make a decision for home with hospice or [place] at this time.  She advised that they need to discuss with the patient when she is awake "because her independence is very important to her and she wants to hold on to it".   I spoke with Roderic Palau, CSW, in the ED and we discussed with Dr. Eulis Foster.  Plan is to admit patient - have PMT consult to establish [place] and follow back up with patient's famly tomorrow.    Thank you for your interest,  Edyth Gunnels, RN, [facility] Liaison [phone]  All hospital liaisons are now on [place].

## 2016-08-20 NOTE — ED Notes (Signed)
Patient transported to X-ray 

## 2016-08-20 NOTE — ED Provider Notes (Signed)
I discussed the case with the hospice nurse provider who is in the department, talking with patient and family members.  According to the provider, the patient has been seeing "hospice palliative providers," which is a Designer, jewellery, available for help but not giving comprehensive "hospice care."  At this time the family members have decided that they would like the patient evaluated by a palliative care team, and possibly admitted to a hospice facility.  We contacted palliative care medicine, who is unable to perform a consult in the emergency department today.  They would be able to see the patient tomorrow, in consultation, to give recommendations for care.  The family members do not feel that the patient can be maintained at home in her current status.  Therefore the patient will be admitted, as observation, overnight, to obtain a palliative consult and further treatment, based on that evaluation.  Screening labs have been ordered per protocol, to facilitate admission.  Earlier plain images were done of the right hip and pelvis, to evaluate for fracture, it was negative.  7:32 PM-Consult complete with hospitalist. Patient case explained and discussed.  He agrees to admit patient for further evaluation and treatment. Call ended at 19: 40   Daleen Bo, MD 08/20/16 [phone]

## 2016-08-20 NOTE — ED Provider Notes (Signed)
[facility] DEPT Provider Note   CSN: [id number] Arrival date & time: 08/20/16  1339     History   Chief Complaint Chief Complaint  Patient presents with  . Fall    HPI Theresa Mckinney is a 76 y.o. female.  Patient with hx brain mass dx a few months ago, c/o fall today.  Family member caught patient and eased her to ground.  Patient denies acute pain/injury.  Since diagnosis of brain mass, has persistent posteriorly located headaches - pain is constant, dull, moderate-severe.  Up to today, pt living independently. Family says patient is followed by palliative care, but that they have trouble reaching them when patient has a need - they think patient needs hospice and/or more care. Pt does not want any treatment for her brain tumor.    The history is provided by the patient.  Fall  Associated symptoms include headaches. Pertinent negatives include no chest pain, no abdominal pain and no shortness of breath.    Past Medical History:  Diagnosis Date  . Arthritis   . Cancer ([place])   . Depression   . Hypertension     Patient Active Problem List   Diagnosis Date Noted  . Chest pain 06/23/2016  . Brain mass 06/23/2016  . Essential hypertension 06/23/2016    Past Surgical History:  Procedure Laterality Date  . ABDOMINAL HYSTERECTOMY    . NEPHRECTOMY      OB History    No data available       Home Medications    Prior to Admission medications   Medication Sig Start Date End Date Taking? Authorizing Provider  acetaminophen (TYLENOL) 500 MG tablet Take 1,000 mg by mouth every 6 (six) hours as needed for moderate pain or headache.    [provider]  cholecalciferol (VITAMIN D) 1000 units tablet Take 2,000 Units by mouth daily.    [provider]  clorazepate (TRANXENE) 7.5 MG tablet Take 7.5 mg by mouth 2 (two) times daily as needed for anxiety.    [provider]  dexamethasone (DECADRON) 2 MG tablet Take 1 tablet (2 mg total) by mouth 2  (two) times daily. 06/24/16   Earnie Larsson, MD  Flaxseed, Linseed, (FLAX SEED OIL) 1000 MG CAPS Take 2,000 mg by mouth 2 (two) times daily.    [provider]  hydrALAZINE (APRESOLINE) 25 MG tablet Take 25 mg by mouth 2 (two) times daily.    [provider]  loratadine (CLARITIN) 10 MG tablet Take 10 mg by mouth daily.    [provider]  nebivolol (BYSTOLIC) 5 MG tablet Take 5 mg by mouth daily.    [provider]    Family History No family history on file.  Social History Social History  Substance Use Topics  . Smoking status: Never Smoker  . Smokeless tobacco: Never Used  . Alcohol use No     Allergies   Aristocort [triamcinolone]; Ciprofloxacin hcl; Codeine; Demerol [meperidine]; Influenza vaccine live; Macrolides and ketolides; Morphine and related; Other; Oxycodone; Simvastatin; Penicillins; and Septra [sulfamethoxazole-trimethoprim]   Review of Systems Review of Systems  Constitutional: Negative for fever.  HENT: Negative for sore throat.   Eyes: Negative for redness.  Respiratory: Negative for shortness of breath.   Cardiovascular: Negative for chest pain.  Gastrointestinal: Negative for abdominal pain and vomiting.  Genitourinary: Negative for flank pain.  Musculoskeletal: Negative for back pain and neck pain.  Skin: Negative for wound.  Neurological: Positive for headaches.  Hematological: Does not bruise/bleed  easily.  Psychiatric/Behavioral: Negative for confusion.     Physical Exam Updated Vital Signs There were no vitals taken for this visit.  Physical Exam  Constitutional: She appears well-developed and well-nourished. No distress.  HENT:  Head: Atraumatic.  Eyes: Conjunctivae are normal. Pupils are equal, round, and reactive to light. No scleral icterus.  Neck: Neck supple. No tracheal deviation present.  Cardiovascular: Normal rate, regular rhythm, normal heart sounds and intact distal pulses.   Pulmonary/Chest:  Effort normal and breath sounds normal. No respiratory distress.  Abdominal: Normal appearance. She exhibits no distension. There is no tenderness.  Musculoskeletal: She exhibits no edema.  CTLS spine, non tender, aligned, no step off. Good rom bil extremities, no focal bony tenderness.   Neurological: She is alert.  Motor intact bil.   Skin: Skin is warm and dry. No rash noted.  Psychiatric: She has a normal mood and affect.  Nursing note and vitals reviewed.    ED Treatments / Results  Labs (all labs ordered are listed, but only abnormal results are displayed) Labs Reviewed - No data to display  EKG  EKG Interpretation None       Radiology Dg Hip Unilat  With Pelvis 2-3 Views Right  Result Date: 08/20/2016 CLINICAL DATA:  Right hip pain after fall today. EXAM: DG HIP (WITH OR WITHOUT PELVIS) 2-3V RIGHT COMPARISON:  None. FINDINGS: There is no evidence of hip fracture or dislocation. There is no evidence of arthropathy or other focal bone abnormality. IMPRESSION: Normal right hip. Electronically Signed   By: Marijo Conception, M.D.   On: 08/20/2016 14:26    Procedures Procedures (including critical care time)  Medications Ordered in ED Medications - No data to display   Initial Impression / Assessment and Plan / ED Course  I have reviewed the triage vital signs and the nursing notes.  Pertinent labs & imaging results that were available during my care of the patient were reviewed by me and considered in my medical decision making (see chart for details).  Nursing ordered imaging - neg acute.   Social work consulted.   Reviewed nursing notes and prior charts for additional history.   Patient indicates does not want further medical workup/treatment, but pt/family interested in hospice care.  Sw and CM consulted.  I also discussed with Palliative Care team on call - they indicate patient is enrolled with Hospice and [facility], and that she has let  the Hospice team know that patient now desires Hospice care - she indicates a hospice team member will come by ED to evaluate patient.  Given current state, I feel patient may be appropriate for inpatient hospice care unit - difficult to determine how much time is left, but feel recent dramatic decline is a very poor prognostic indicator.   Signed out to Dr Eulis Foster to f/u with Hospice/CM evaluation.      Final Clinical Impressions(s) / ED Diagnoses   Final diagnoses:  None    New Prescriptions New Prescriptions   No medications on file     Lajean Saver, MD 08/20/16 [phone]

## 2016-08-20 NOTE — Progress Notes (Signed)
Pharmacy Antibiotic Note  Theresa Mckinney is a 76 y.o. female with increased lethargy admitted on 08/20/2016 with pneumonia.  Pharmacy has been consulted for azactam and vancomycin dosing.  Plan: Azactam 2 Gm IV q8h Vancomycin 1 Gm IV q24h VT=15-20 mg/L F/u scr/cultures     Temp (24hrs), Avg:99 F (37.2 C), Min:98.6 F (37 C), Max:99.4 F (37.4 C)   Recent Labs Lab 08/20/16 1804  WBC 11.5*  CREATININE 1.23*    CrCl cannot be calculated (Unknown ideal weight.).    Allergies  Allergen Reactions  . Aristocort [Triamcinolone] Other (See Comments)    Skin felt like it was burning  . Ciprofloxacin Hcl Other (See Comments)    Paresthesia  . Codeine Nausea Only  . Demerol [Meperidine] Nausea Only  . Influenza Vaccine Live Swelling and Other (See Comments)    Arm swelling  . Macrolides And Ketolides Nausea Only  . Morphine And Related Nausea Only  . Other Other (See Comments)    Biphosphonates - Unknown  . Oxycodone Other (See Comments)    Severely pruritic rash  . Simvastatin Other (See Comments)    Dyspepsia   . Penicillins Rash  . Septra [Sulfamethoxazole-Trimethoprim] Rash    Antimicrobials this admission: 6/11 azactam >>  6/11 zmax >>  6/11 vancomycin >>  Dose adjustments this admission:   Microbiology results:  BCx:   UCx:    Sputum:    MRSA PCR:   Thank you for allowing pharmacy to be a part of this patient's care.  Dorrene German 08/20/2016 10:17 PM

## 2016-08-20 NOTE — ED Triage Notes (Signed)
Patient coming from home just started hospice care for brain cancer. Patient fell today on carpet. Right hip having range of motion pain. Wants an x-ray and nothing else.

## 2016-08-20 NOTE — Progress Notes (Addendum)
CSW met with family and family explained they are not ready to make a decision on whether the pt goes home with hospice, or seek admission to [facility], but feel pt cannot go home with [place] with Palliative as they feel pt has declined and Home Health is no longer sufficient.  At this time pt is unresponsive.  CSW met with Amy of [facility] who stated pt's family was not ready to decide to go to [facility].  [facility] has no open beds at this time, but pt's family can place pt on the wait list if they desire and begin process.  At this time family hopes pt can be seen from Palliative and hopefully admitted to inpatient palliative care.    EPD from [facility] had placed a palliative care consult.  CSW called palliative care provider on-call who stated consult should be able to be responded to on 6/12 if pt is admitted.  CSW consulted with EDP on-duty then with family and EDP is making a determination at this time on pt's disposition. Please reconsult if future social work needs arise.  CSW will continue to follow.  Alphonse Guild. Nasean Zapf, Latanya Presser, LCAS Clinical Social Worker Ph: [phone]

## 2016-08-20 NOTE — Progress Notes (Signed)
CXR suggestive of possible LLL PNA, patient T also elevated at 99.4.  Will treat for presumptive CAP: 1) PNA pathway 2) Empiric aztreonam, vanc, and azithromycin: (Rash to PCN, paresthesia to flouroquinolones, nausea only to macrolides, I see she has tolerated vanc pre-op before in 2011). 3) Cultures pending

## 2016-08-20 NOTE — H&P (Signed)
History and Physical    Theresa Mckinney CNO:[id number] DOB: [birth year] DOA: 08/20/2016  PCP: System, Provider Not In  Patient coming from: Home  I have personally briefly reviewed patient's old medical records in [place]  Chief Complaint: AMS  HPI: Theresa Mckinney is a 76 y.o. 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  Declined treatment for this (invasive or otherwise).  Has been slowly declining over past 2 months until last 2 days when she had significant worsening of AMS.  Increased lethargy.  Fall at home today.  Will occasionally wake up, answer one question, or take a pill, then fall back asleep.  Patient brought in by family due to increased lethargy and likely increased care needs.  Family also notes cough since noon today.   Review of Systems: Unable to perform due to lethargy.  Past Medical History:  Diagnosis Date  . Arthritis   . Cancer ([place])   . Depression   . Hypertension     Past Surgical History:  Procedure Laterality Date  . ABDOMINAL HYSTERECTOMY    . NEPHRECTOMY       reports that she has never smoked. She has never used smokeless tobacco. She reports that she does not drink alcohol. Her drug history is not on file.  Allergies  Allergen Reactions  . Aristocort [Triamcinolone] Other (See Comments)    Skin felt like it was burning  . Ciprofloxacin Hcl Other (See Comments)    Paresthesia  . Codeine Nausea Only  . Demerol [Meperidine] Nausea Only  . Influenza Vaccine Live Swelling and Other (See Comments)    Arm swelling  . Macrolides And Ketolides Nausea Only  . Morphine And Related Nausea Only  . Other Other (See Comments)    Biphosphonates - Unknown  . Oxycodone Other (See Comments)    Severely pruritic rash  . Simvastatin Other (See Comments)    Dyspepsia   . Penicillins Rash  . Septra [Sulfamethoxazole-Trimethoprim] Rash    No family history on file.   Prior to Admission medications    Medication Sig Start Date End Date Taking? Authorizing Provider  acetaminophen (TYLENOL) 500 MG tablet Take 1,000 mg by mouth every 6 (six) hours as needed for moderate pain or headache.   Yes [provider]  cholecalciferol (VITAMIN D) 1000 units tablet Take 2,000 Units by mouth daily.   Yes [provider]  clorazepate (TRANXENE) 3.75 MG tablet TAKE 1 TABLET EACH MORNING AND 1 TABLET IN EVENING 08/08/16  Yes [provider]  dexamethasone (DECADRON) 2 MG tablet Take 1 tablet (2 mg total) by mouth 2 (two) times daily. 06/24/16  Yes Pool, Mallie Mussel, MD  Flaxseed, Linseed, (FLAX SEED OIL) 1000 MG CAPS Take 2,000 mg by mouth 2 (two) times daily.   Yes [provider]  hydrALAZINE (APRESOLINE) 25 MG tablet Take 25 mg by mouth 2 (two) times daily.   Yes [provider]  loratadine (CLARITIN) 10 MG tablet Take 10 mg by mouth daily.   Yes [provider]  nebivolol (BYSTOLIC) 5 MG tablet Take 5 mg by mouth daily.   Yes [provider]    Physical Exam: Vitals:   08/20/16 1606 08/20/16 1808  BP: (!) 146/57 (!) 141/62  Pulse: 63 (!) 59  Resp: 12 12  Temp: 98.6 F (37 C) 98.9 F (37.2 C)  TempSrc: Oral Oral  SpO2: 94% 96%    Constitutional: Lethargic Eyes: PERRL, lids and conjunctivae  normal ENMT: Mucous membranes are moist. Posterior pharynx clear of any exudate or lesions.Normal dentition.  Neck: normal, supple, no masses, no thyromegaly Respiratory: clear to auscultation bilaterally, no wheezing, no crackles. Normal respiratory effort. No accessory muscle use.  Cardiovascular: Regular rate and rhythm, no murmurs / rubs / gallops. No extremity edema. 2+ pedal pulses. No carotid bruits.  Abdomen: no tenderness, no masses palpated. No hepatosplenomegaly. Bowel sounds positive.  Musculoskeletal: no clubbing / cyanosis. No joint deformity upper and lower extremities. Good ROM, no contractures. Normal muscle tone.  Skin: no rashes,  lesions, ulcers. No induration Neurologic: Lethargic, minimally responsive Psychiatric: Lethargic   Labs on Admission: I have personally reviewed following labs and imaging studies  CBC:  Recent Labs Lab 08/20/16 1804  WBC 11.5*  NEUTROABS 9.5*  HGB 13.4  HCT 39.0  MCV 96.5  PLT 193*   Basic Metabolic Panel:  Recent Labs Lab 08/20/16 1804  NA 137  K 4.3  CL 104  CO2 27  GLUCOSE 134*  BUN 36*  CREATININE 1.23*  CALCIUM 9.0   GFR: CrCl cannot be calculated (Unknown ideal weight.). Liver Function Tests: No results for input(s): AST, ALT, ALKPHOS, BILITOT, PROT, ALBUMIN in the last 168 hours. No results for input(s): LIPASE, AMYLASE in the last 168 hours. No results for input(s): AMMONIA in the last 168 hours. Coagulation Profile: No results for input(s): INR, PROTIME in the last 168 hours. Cardiac Enzymes: No results for input(s): CKTOTAL, CKMB, CKMBINDEX, TROPONINI in the last 168 hours. BNP (last 3 results) No results for input(s): PROBNP in the last 8760 hours. HbA1C: No results for input(s): HGBA1C in the last 72 hours. CBG: No results for input(s): GLUCAP in the last 168 hours. Lipid Profile: No results for input(s): CHOL, HDL, LDLCALC, TRIG, CHOLHDL, LDLDIRECT in the last 72 hours. Thyroid Function Tests: No results for input(s): TSH, T4TOTAL, FREET4, T3FREE, THYROIDAB in the last 72 hours. Anemia Panel: No results for input(s): VITAMINB12, FOLATE, FERRITIN, TIBC, IRON, RETICCTPCT in the last 72 hours. Urine analysis:    Component Value Date/Time   COLORURINE STRAW (A) 06/23/2016 1340   APPEARANCEUR CLEAR 06/23/2016 1340   LABSPEC 1.005 06/23/2016 1340   PHURINE 7.0 06/23/2016 1340   GLUCOSEU NEGATIVE 06/23/2016 1340   HGBUR NEGATIVE 06/23/2016 1340   BILIRUBINUR NEGATIVE 06/23/2016 1340   KETONESUR NEGATIVE 06/23/2016 1340   PROTEINUR NEGATIVE 06/23/2016 1340   NITRITE NEGATIVE 06/23/2016 1340   LEUKOCYTESUR NEGATIVE 06/23/2016 1340     Radiological Exams on Admission: Dg Hip Unilat  With Pelvis 2-3 Views Right  Result Date: 08/20/2016 CLINICAL DATA:  Right hip pain after fall today. EXAM: DG HIP (WITH OR WITHOUT PELVIS) 2-3V RIGHT COMPARISON:  None. FINDINGS: There is no evidence of hip fracture or dislocation. There is no evidence of arthropathy or other focal bone abnormality. IMPRESSION: Normal right hip. Electronically Signed   By: Marijo Conception, M.D.   On: 08/20/2016 14:26    EKG: Independently reviewed.  Assessment/Plan Principal Problem:   Acute metabolic encephalopathy Active Problems:   Brain mass   Essential hypertension    1. Acute encephalopathy - 1. Declining mental status over past 2 days 2. DDx includes worsening of GBM vs infectious cause (PNA with Cough?) 3. CXR and UA ordered 4. If PNA or UTI found, then treat with ABx 1. Will put on IVF for the meantime for limited duration (as discussed in patients MOST form). 5. Otherwise if nothing found, then likely represents worsening secondary GBM, then plan  to progress to hospice care. 2. Brain mass - 1. 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 Annette Stable told them, no primary tumor findings on CT scans 2 months ago. 2. Patient has declined treatment for this 3. Pal care consult in AM 4. No CNS imaging ordered at this point as I dont feel it would really change management in this patient.  DVT prophylaxis: None due to hospice status Code Status: DNR/DNI, no tube feeds, ABx if needed, IVF for limited duration Family Communication: Family at bedside Disposition Plan: TBD Consults called: Pal care called by EDP Admission status: Place in [place], [facility] Pager [phone]  If 7AM-7PM, please contact day team taking care of patient [email or web address] Password [email or web address]  08/20/2016, 8:25 PM

## 2016-08-20 NOTE — Plan of Care (Signed)
Hospice and CSW notes are noted.  [facility] will plan to see the patient and family on 6/12 to establish goals of care. Thank you for this consult.  [facility] Practice Administrator [phone] (7am-7pm)

## 2016-08-20 NOTE — ED Notes (Signed)
Bed: WA22 Expected date:  Expected time:  Means of arrival:  Comments: hold 

## 2016-08-20 NOTE — ED Notes (Signed)
Pt has been transferred from hall D to room 22, gowned, blood pressure cuff & O2 sats monitors placed on pt.

## 2016-08-20 NOTE — Progress Notes (Signed)
CSW received a call from Palliative Care at [phone] who confirmed an evaluation for palliative care can likely be completed tomorrow if pt is admitted and if not the pt can be seen at home.  CSW called back and left a HIPPA-compliant VM stating pt's family wishes are for inpatient palliative care and not home with Hospice and not Hospice Care, such as [employer] [facility], at this time.    Alphonse Guild. Damonte Frieson, Latanya Presser, LCAS Clinical Social Worker Ph: [phone]

## 2016-08-20 NOTE — Plan of Care (Signed)
Palliative Care Consult received. Patient is active with [facility]'s outpatient palliative care service.  Per Dr. Ashok Cordia, family is requesting hospice services at this time. [facility] alerted that the patient is in the ED and requested hospice evaluation.  Edyth Gunnels, RN with [facility], responded they may not be able to evaluate the patient until tomorrow due to high patient volumes.  If patient is admitted, plan for hospice evaluation to occur tomorrow to discuss appropriate level of hospice care (home v. Residential hospice facility).  If patient is discharged before hospice discussion occurs, [facility] will follow up with the patient and family at home.  [facility] will not see the patient at this time since hospice referral as been made, and we are unaware of any other palliative care needs at this time. Thank you for this consult.    [facility]  Practice Administrator [phone] (7am-7pm)

## 2016-08-21 ENCOUNTER — Observation Stay (HOSPITAL_COMMUNITY): Payer: Medicare Other

## 2016-08-21 DIAGNOSIS — Z882 Allergy status to sulfonamides status: Secondary | ICD-10-CM | POA: Diagnosis not present

## 2016-08-21 DIAGNOSIS — Y92009 Unspecified place in unspecified non-institutional (private) residence as the place of occurrence of the external cause: Secondary | ICD-10-CM | POA: Diagnosis not present

## 2016-08-21 DIAGNOSIS — R4020 Unspecified coma: Secondary | ICD-10-CM | POA: Diagnosis present

## 2016-08-21 DIAGNOSIS — W19XXXA Unspecified fall, initial encounter: Secondary | ICD-10-CM | POA: Diagnosis present

## 2016-08-21 DIAGNOSIS — Z515 Encounter for palliative care: Secondary | ICD-10-CM | POA: Diagnosis present

## 2016-08-21 DIAGNOSIS — F419 Anxiety disorder, unspecified: Secondary | ICD-10-CM | POA: Diagnosis present

## 2016-08-21 DIAGNOSIS — Z7189 Other specified counseling: Secondary | ICD-10-CM

## 2016-08-21 DIAGNOSIS — F329 Major depressive disorder, single episode, unspecified: Secondary | ICD-10-CM | POA: Diagnosis present

## 2016-08-21 DIAGNOSIS — G932 Benign intracranial hypertension: Secondary | ICD-10-CM | POA: Diagnosis present

## 2016-08-21 DIAGNOSIS — Z885 Allergy status to narcotic agent status: Secondary | ICD-10-CM | POA: Diagnosis not present

## 2016-08-21 DIAGNOSIS — J189 Pneumonia, unspecified organism: Secondary | ICD-10-CM | POA: Diagnosis present

## 2016-08-21 DIAGNOSIS — G936 Cerebral edema: Secondary | ICD-10-CM | POA: Diagnosis present

## 2016-08-21 DIAGNOSIS — C719 Malignant neoplasm of brain, unspecified: Secondary | ICD-10-CM

## 2016-08-21 DIAGNOSIS — M1990 Unspecified osteoarthritis, unspecified site: Secondary | ICD-10-CM | POA: Diagnosis present

## 2016-08-21 DIAGNOSIS — G9341 Metabolic encephalopathy: Secondary | ICD-10-CM | POA: Diagnosis not present

## 2016-08-21 DIAGNOSIS — G919 Hydrocephalus, unspecified: Secondary | ICD-10-CM | POA: Diagnosis present

## 2016-08-21 DIAGNOSIS — Z905 Acquired absence of kidney: Secondary | ICD-10-CM | POA: Diagnosis not present

## 2016-08-21 DIAGNOSIS — R4182 Altered mental status, unspecified: Secondary | ICD-10-CM | POA: Diagnosis present

## 2016-08-21 DIAGNOSIS — Z66 Do not resuscitate: Secondary | ICD-10-CM | POA: Diagnosis present

## 2016-08-21 DIAGNOSIS — Z881 Allergy status to other antibiotic agents status: Secondary | ICD-10-CM | POA: Diagnosis not present

## 2016-08-21 DIAGNOSIS — Z880 Allergy status to penicillin: Secondary | ICD-10-CM | POA: Diagnosis not present

## 2016-08-21 DIAGNOSIS — Z7951 Long term (current) use of inhaled steroids: Secondary | ICD-10-CM | POA: Diagnosis not present

## 2016-08-21 DIAGNOSIS — Z79899 Other long term (current) drug therapy: Secondary | ICD-10-CM | POA: Diagnosis not present

## 2016-08-21 DIAGNOSIS — I10 Essential (primary) hypertension: Secondary | ICD-10-CM | POA: Diagnosis present

## 2016-08-21 DIAGNOSIS — J690 Pneumonitis due to inhalation of food and vomit: Secondary | ICD-10-CM

## 2016-08-21 DIAGNOSIS — R402433 Glasgow coma scale score 3-8, at hospital admission: Secondary | ICD-10-CM

## 2016-08-21 DIAGNOSIS — Z90710 Acquired absence of both cervix and uterus: Secondary | ICD-10-CM | POA: Diagnosis not present

## 2016-08-21 DIAGNOSIS — Z888 Allergy status to other drugs, medicaments and biological substances status: Secondary | ICD-10-CM | POA: Diagnosis not present

## 2016-08-21 LAB — HIV ANTIBODY (ROUTINE TESTING W REFLEX): HIV SCREEN 4TH GENERATION: NONREACTIVE

## 2016-08-21 MED ORDER — DEXTROSE 5 % IV SOLN
1.0000 g | INTRAVENOUS | Status: DC
  Administered 2016-08-21: 1 g via INTRAVENOUS
  Filled 2016-08-21: qty 10

## 2016-08-21 MED ORDER — GLYCOPYRROLATE 0.2 MG/ML IJ SOLN
0.2000 mg | Freq: Four times a day (QID) | INTRAMUSCULAR | Status: DC
  Administered 2016-08-22 – 2016-08-23 (×5): 0.2 mg via INTRAVENOUS
  Filled 2016-08-21 (×8): qty 1

## 2016-08-21 MED ORDER — GLYCOPYRROLATE 0.2 MG/ML IJ SOLN
0.2000 mg | Freq: Once | INTRAMUSCULAR | Status: AC
  Administered 2016-08-21: 0.2 mg via INTRAVENOUS
  Filled 2016-08-21: qty 1

## 2016-08-21 MED ORDER — HYDRALAZINE HCL 20 MG/ML IJ SOLN
10.0000 mg | INTRAMUSCULAR | Status: DC | PRN
  Filled 2016-08-21: qty 0.5

## 2016-08-21 MED ORDER — ACETAMINOPHEN 650 MG RE SUPP: 650.0000 mg | Freq: Four times a day (QID) | RECTAL | Status: DC | PRN

## 2016-08-21 MED ORDER — HALOPERIDOL LACTATE 5 MG/ML IJ SOLN: 0.5000 mg | INTRAMUSCULAR | Status: DC | PRN

## 2016-08-21 MED ORDER — BIOTENE DRY MOUTH MT LIQD: 15.0000 mL | OROMUCOSAL | Status: DC | PRN

## 2016-08-21 MED ORDER — METRONIDAZOLE IN NACL 5-0.79 MG/ML-% IV SOLN
500.0000 mg | Freq: Three times a day (TID) | INTRAVENOUS | Status: DC
  Filled 2016-08-21 (×2): qty 100

## 2016-08-21 MED ORDER — VANCOMYCIN HCL IN DEXTROSE 1-5 GM/200ML-% IV SOLN
1000.0000 mg | Freq: Every day | INTRAVENOUS | Status: DC
  Administered 2016-08-21: 1000 mg via INTRAVENOUS
  Filled 2016-08-21: qty 200

## 2016-08-21 MED ORDER — POLYVINYL ALCOHOL 1.4 % OP SOLN
1.0000 [drp] | Freq: Four times a day (QID) | OPHTHALMIC | Status: DC | PRN
  Filled 2016-08-21: qty 15

## 2016-08-21 MED ORDER — DEXAMETHASONE SODIUM PHOSPHATE 4 MG/ML IJ SOLN
4.0000 mg | Freq: Four times a day (QID) | INTRAMUSCULAR | Status: DC
  Administered 2016-08-21 – 2016-08-22 (×4): 4 mg via INTRAVENOUS
  Filled 2016-08-21 (×4): qty 1

## 2016-08-21 MED ORDER — LORAZEPAM 2 MG/ML IJ SOLN: 1.0000 mg | INTRAMUSCULAR | Status: DC | PRN

## 2016-08-21 NOTE — Care Management Note (Signed)
Case Management Note  Patient Details  Name: Theresa Mckinney MRN: [id number] Date of Birth: January 14, 1941  Subjective/Objective:                   76 y.o. 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  Declined treatment for this (invasive or otherwise).  Has been slowly declining over past 2 months until last 2 days when she had significant worsening of AMS.  Increased lethargy.  Fall at home today.  Will occasionally wake up, answer one question, or take a pill, then fall back asleep.  Patient brought in by family due to increased lethargy and likely increased care needs.  Family also notes cough since noon today.  Action/Plan: Date:  August 21, 2016  Chart reviewed for concurrent status and case management needs.  Will continue to follow patient progress.  Discharge Planning: following for needs  Expected discharge date: [id number]  Velva Harman, BSN, [place], [place]   Expected Discharge Date:   (unknown)               Expected Discharge Plan:  Home/Self Care  In-House Referral:     Discharge planning Services  CM Consult  Post Acute Care Choice:    Choice offered to:     DME Arranged:    DME Agency:     HH Arranged:    [place] Agency:     Status of Service:  In process, will continue to follow  If discussed at Long Length of Stay Meetings, dates discussed:    Additional Comments:  Leeroy Cha, RN 08/21/2016, 9:28 AM

## 2016-08-21 NOTE — Progress Notes (Signed)
Pt arrived to unit room 1508 via stretcher with family at the bedside. VS taken. Pt lethargic. Bed alarm on, family oriented to room and callbell, initial assessment completed, will continue to monitor

## 2016-08-21 NOTE — Progress Notes (Signed)
Date: August 21, 2016 List of Hospice Home Providers given to family. Vernia Buff, [phone]

## 2016-08-21 NOTE — Progress Notes (Signed)
PROGRESS NOTE    Theresa Mckinney  OIN:[id number] DOB: 1940/03/22 DOA: 08/20/2016 PCP: System, Provider Not In    Brief Narrative:  76 year old female who presented to hospital with altered mental status. 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 She had declined treatment for this brain mass. Over last 2 months prior to hospitalization her functional status had been declining, over last 2 days had significant altered mentation and lethargy. On the day of admission after sustaining a fall, without head trauma, she became unresponsive. Per her family she has been coughing yellow productive phlegm over last 2 days. On initial physical examination blood pressure 146/57, heart rate 63, respiratory rate 12, temperature 98.6, oxygen saturation 94%, very lethargic and poorly responsive, mucous membranes were moist, lungs were clear to auscultation bilaterally, decreased breath sounds at the left base, heart S1-S2 present and rhythmic, abdomen was soft and nontender, no lower extremity edema. Sodium 137, potassium 4.3, chloride 104, bicarbonate 27, glucose 134, BUN 36, creatinine 1.23, white count 11.5, hemoglobin 13.4, hematocrit 39.0, platelets 140. Chest x-ray with left lower lobe infiltrate.  The patient was admitted to hospital with acute encephalopathy likely related to worsening intracranial hypertension due to likely progressive brain mass, complicated by left lower lobe pneumonia.   Assessment & Plan:   Principal Problem:   Acute metabolic encephalopathy Active Problems:   Brain mass   Essential hypertension   Malignant neoplasm of brain ([place])   Goals of care, counseling/discussion   Palliative care by specialist   1. Encephalopathy due to intracranial hypertension. Very weak oculocephalic reflexes and no gag reflex. Very poor prognosis. Will follow on non contrast CT, to further clarify prognosis. Will continue supportive medical care with a main  focus in patient's comfort. Case discussed with palliative care.   2. Left lower lobe pneumonia, suspected aspiration, present on admission.  Chest film personally reviewed, noted dense infiltrate at the left lower, lobe, likely aspiration, will continue with Unasyn for now IV. Will have a low threshold to stop antibiotic therapy depending on final prognosis.   Patient with a life threatening condition, with very poor prognosis, this has been discussed with patient's sister at the bedside. Patient is DNR and DNI.    DVT prophylaxis: SCD Code Status: DNR Family Communication: I spoke with patient's sister at the bedside and all questions were addressed.  Disposition Plan: Home    Consultants:   Palliative care   Procedures:     Antimicrobials:   Unasyn    Subjective: Patient not responsive, all history from nurse and patient's family at the bedside.   Objective: Vitals:   08/20/16 2259 08/21/16 0550 08/21/16 0900 08/21/16 1400  BP:  (!) 142/61 (!) 133/55 (!) 147/60  Pulse:  62 62 61  Resp:  14  20  Temp:  (!) 100.4 F (38 C)  99.7 F (37.6 C)  TempSrc:  Axillary  Axillary  SpO2:  95% 97% 94%  Weight: 61.4 kg (135 lb 5.8 oz)     Height: 5' (1.524 m)       Intake/Output Summary (Last 24 hours) at 08/21/16 1448 Last data filed at 08/21/16 1200  Gross per 24 hour  Intake             1500 ml  Output                0 ml  Net  1500 ml   Filed Weights   08/20/16 2259  Weight: 61.4 kg (135 lb 5.8 oz)    Examination:  General exam: not responsive E ENT: mild pallor, no icterus, oral mucosa moist.  Respiratory system: Clear to auscultation. Respiratory effort normal. Decreased breath sounds at the left base, poor inspiratory effort and positive central rhonchi.  Cardiovascular system: S1 & S2 heard, RRR. No JVD, murmurs, rubs, gallops or clicks. No pedal edema. Gastrointestinal system: Abdomen is nondistended, soft and nontender. No organomegaly or masses  felt. Normal bowel sounds heard. Central nervous system: Comatose, not responsive very weak oculocephalic reflexes and no gag reflex.   Extremities: Symmetric 5 x 5 power. Skin: No rashes, lesions or ulcers     Data Reviewed: I have personally reviewed following labs and imaging studies  CBC:  Recent Labs Lab 08/20/16 1804  WBC 11.5*  NEUTROABS 9.5*  HGB 13.4  HCT 39.0  MCV 96.5  PLT 081*   Basic Metabolic Panel:  Recent Labs Lab 08/20/16 1804  NA 137  K 4.3  CL 104  CO2 27  GLUCOSE 134*  BUN 36*  CREATININE 1.23*  CALCIUM 9.0   GFR: Estimated Creatinine Clearance: 31.4 mL/min (A) (by C-G formula based on SCr of 1.23 mg/dL (H)). Liver Function Tests: No results for input(s): AST, ALT, ALKPHOS, BILITOT, PROT, ALBUMIN in the last 168 hours. No results for input(s): LIPASE, AMYLASE in the last 168 hours. No results for input(s): AMMONIA in the last 168 hours. Coagulation Profile: No results for input(s): INR, PROTIME in the last 168 hours. Cardiac Enzymes: No results for input(s): CKTOTAL, CKMB, CKMBINDEX, TROPONINI in the last 168 hours. BNP (last 3 results) No results for input(s): PROBNP in the last 8760 hours. HbA1C: No results for input(s): HGBA1C in the last 72 hours. CBG: No results for input(s): GLUCAP in the last 168 hours. Lipid Profile: No results for input(s): CHOL, HDL, LDLCALC, TRIG, CHOLHDL, LDLDIRECT in the last 72 hours. Thyroid Function Tests: No results for input(s): TSH, T4TOTAL, FREET4, T3FREE, THYROIDAB in the last 72 hours. Anemia Panel: No results for input(s): VITAMINB12, FOLATE, FERRITIN, TIBC, IRON, RETICCTPCT in the last 72 hours. Sepsis Labs: No results for input(s): PROCALCITON, LATICACIDVEN in the last 168 hours.  No results found for this or any previous visit (from the past 240 hour(s)).       Radiology Studies: Ct Head Wo Contrast  Result Date: 08/21/2016 CLINICAL DATA:  Encephalopathy. Brain tumor, treatment for  brain tumor declined EXAM: CT HEAD WITHOUT CONTRAST TECHNIQUE: Contiguous axial images were obtained from the base of the skull through the vertex without intravenous contrast. COMPARISON:  MRI 06/24/2016, CT 06/23/2016 FINDINGS: Brain: Right occipital mass lesion is difficult to measure without intravenous contrast. There is extensive vasogenic edema in the right occipital lobe extending into the right frontal lobe with marked progression from prior studies. Increase mass-effect and midline shift now 13 mm to the left. Negative for acute hemorrhage. Progressive hydrocephalus. Third fourth and lateral ventricles all dilated. Periventricular white matter edema has progressed due to transependymal resorption of CSF from hydrocephalus. Vascular: Negative for hyperdense vessel Skull: Negative Sinuses/Orbits: Mucosal edema paranasal sinuses with air-fluid level sphenoid sinus. Negative orbit Other: None IMPRESSION: Large right occipital mass lesion, difficult to compare with the prior study due to lack of intravenous contrast. Marked progression vasogenic edema on the right. Marked progression of midline shift now 13 mm. 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 Negative for hemorrhage. Electronically Signed  By: Franchot Gallo M.D.   On: 08/21/2016 11:24   Dg Chest Port 1 View  Result Date: 08/20/2016 CLINICAL DATA:  Patient fell today on carpet.  Right hip pain. EXAM: PORTABLE CHEST 1 VIEW COMPARISON:  06/23/2016 FINDINGS: 2001 hours. Patient is rotated to the left. Left base collapse/consolidation is new in the interval. Right lung is clear. The cardio pericardial silhouette is enlarged. The visualized bony structures of the thorax are intact. IMPRESSION: Low volume film with cardiomegaly and left base collapse/ consolidation. Pneumonia at the left lung base cannot be excluded. Electronically Signed   By: Misty Stanley M.D.   On: 08/20/2016 20:33   Dg Hip Unilat  With Pelvis 2-3 Views  Right  Result Date: 08/20/2016 CLINICAL DATA:  Right hip pain after fall today. EXAM: DG HIP (WITH OR WITHOUT PELVIS) 2-3V RIGHT COMPARISON:  None. FINDINGS: There is no evidence of hip fracture or dislocation. There is no evidence of arthropathy or other focal bone abnormality. IMPRESSION: Normal right hip. Electronically Signed   By: Marijo Conception, M.D.   On: 08/20/2016 14:26        Scheduled Meds: . dexamethasone  4 mg Intravenous Q6H   Continuous Infusions: . sodium chloride 100 mL/hr at 08/21/16 0700  . azithromycin Stopped (08/20/16 2300)  . cefTRIAXone (ROCEPHIN)  IV Stopped (08/21/16 1157)  . metronidazole       LOS: 0 days       Tawni Millers, MD [facility] Pager [phone]  If 7PM-7AM, please contact night-coverage [email or web address] Password [facility] 08/21/2016, 2:48 PM

## 2016-08-21 NOTE — Progress Notes (Signed)
Addendum to initial consult note.  CT head obtained which showed: "Large right occipital mass lesion, difficult to compare with the prior study due to lack of intravenous contrast. Marked progression vasogenic edema on the right. Marked progression of midline shift now 13 mm. 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 Negative for hemorrhage."   I revisited the patient's family to discuss results. Theresa Mckinney was present with her cousin and her daughter. I brought up the CT and reviewed the image with them, explaining the increase in edema, significance of midline shift, and now moderate hydrocephalus. Given these findings in the context of her clinical presentation, I suspect she is in the process of herniation. While we can continue the steroid, I doubt it will have a role in her management at this point. I suspect she has hours to a day left, which I shared with her family. I do not feel she is stable for transport, and anticipate a hospital death.   Plan -DNR, transition to full comfort measures (as discussed with pt's son/HCPOA) -Anticipated hospital death   Charlynn Court [facility]iative [facility] [phone]

## 2016-08-21 NOTE — Progress Notes (Signed)
Resumed care for pt. Agree with previous RN's assessment. Will continue to monitor and follow plan of care.

## 2016-08-21 NOTE — Consult Note (Signed)
Consultation Note Date: 08/21/2016   Patient Name: Theresa Mckinney  DOB: 1940/06/18  MRN: [id number]  Age / Sex: 76 y.o., female  PCP: System, Provider Not In Referring Physician: Tawni Millers  Reason for Consultation: Establishing goals of care and Psychosocial/spiritual support  HPI/Patient Profile: 76 y.o. female  with past medical history of brain mass (found in April, likely primary GBM with treatment declined), HTN, renal cell carcinoma s/p left nephrectomy, and anxiety. She presented to the ED with worsening confusion, lethargy, and a fall. These symptoms started ~2 days prior to admission. She was admitted on 08/20/2016.  Imaging suggestive of LLL PNA, with plan for antibiotics. She is followed by [facility] as an outpatient. Palliative consulted to assist in goals of care.  Clinical Assessment and Goals of Care: Theresa Mckinney is unresponsive in bed despite painful stimuli. Her sister, Theresa Mckinney, is at her bedside. Her son, who is her 80, was here all night and had just left to get some sleep.   Theresa Mckinney and I talked about how things have been going since her diagnosis of her brain mass--likely GBM--in April. She relates that Theresa Mckinney has been at home and predominantly independent. She stopped driving prior to April, but has otherwise been independent in all of her ADLs, house tasks, and meal preparation. She has asked for stand-by assistance with bathing in the past month, mainly due to balance concerns, and has also noted some issues with her vision and number comprehension. A few days prior to admission she had a little more confusion and balance issues, but was otherwise her normal self and was easily reoriented. A fall at home with increased confused prompted ED presentation, with acute decline in responsiveness noted by Theresa Mckinney about 30 min after arriving to the ED.  In talking through this health history it is apparent that there was a significant  acute decline in a person who was doing much better shortly before. While the pneumonia could account for some of this, I am concerned that her unresponsiveness is not fully explained by that. I shared my concerns with Theresa Mckinney. She was struggling with progressive next steps, as she is confused on the etiology of this rapid decline as well. She is also hopeful that there can be something done to improve her status, as she was laughing and conversational 48 hours ago.   I talked through different options for care, which included an ongoing trial of antibiotics to see the effect, progression to comfort care, or further evaluation for underlying etiology. After consideration, Theresa Mckinney asked if we could evaluate Jenasia further to identify any reversible causes. She was clear that she does not want invasive or aggressive evaluations (i.e surgery, painful procedures, etc.), but she is fine with imaging. She feels Theresa Mckinney would also be in agreement with imaging if could help clarify what's going on.  Primary Decision Maker Pt's HCPOA is her son, Theresa Mckinney.    SUMMARY OF RECOMMENDATIONS    Given family preferences, I would strongly encourage re-imaging brain to evaluate status of brain mass and edema. This will help guide further goals of care conversations and disposition  Consider increasing steroids given high degree of suspicion for disease progression, but will defer to primary team given need to balance risk/benefit in the setting of known infection (would obtain imaging first).  Code Status/Advance Care Planning:  DNR  Symptom Management:   Pt appears comfortable in bed  Palliative Prophylaxis:   Aspiration, Frequent Pain Assessment, Oral Care and Turn Reposition  Additional Recommendations (Limitations,  Scope, Preferences):  Family is focused on ensuring comfort and dignity, however they would like to treat the treatable if it would result in improved quality of life and return of prior to  admission functional status. No aggressive or invasive interventions desired.   Psycho-social/Spiritual:   Desire for further Chaplaincy support:no  Additional Recommendations: Education on Hospice  Prognosis:   Unable to determine. I suspect there is progression of her underlying brain mass and/or associated vasogenic edema. Steroids may help this, but she would still likely only have weeks left. If steroids will not help, she has days to weeks (<2 weeks) as she is unresponsive.   Discharge Planning: To Be Determined      Primary Diagnoses: Present on Admission: . (Resolved) GBM (glioblastoma multiforme) ([place]) . Acute metabolic encephalopathy . Brain mass . Essential hypertension   I have reviewed the medical record, interviewed the patient and family, and examined the patient. The following aspects are pertinent.  Past Medical History:  Diagnosis Date  . Arthritis   . Cancer ([place])   . Depression   . Hypertension    Social History   Social History  . Marital status: Widowed    Spouse name: N/A  . Number of children: N/A  . Years of education: N/A   Social History Main Topics  . Smoking status: Never Smoker  . Smokeless tobacco: Never Used  . Alcohol use No  . Drug use: Unknown  . Sexual activity: Not Asked   Other Topics Concern  . None   Social History Narrative  . None   No family history on file. Scheduled Meds: . clorazepate  3.75 mg Oral BID  . dexamethasone  2 mg Oral BID  . hydrALAZINE  25 mg Oral BID  . nebivolol  5 mg Oral Daily   Continuous Infusions: . sodium chloride 100 mL/hr at 08/20/16 2200  . azithromycin Stopped (08/20/16 2300)  . aztreonam Stopped (08/21/16 7408)  . vancomycin Stopped (08/21/16 0230)   PRN Meds:.acetaminophen, ondansetron Allergies  Allergen Reactions  . Aristocort [Triamcinolone] Other (See Comments)    Skin felt like it was burning  . Ciprofloxacin Hcl Other (See Comments)    Paresthesia  . Codeine Nausea  Only  . Demerol [Meperidine] Nausea Only  . Influenza Vaccine Live Swelling and Other (See Comments)    Arm swelling  . Macrolides And Ketolides Nausea Only  . Morphine And Related Nausea Only  . Other Other (See Comments)    Biphosphonates - Unknown  . Oxycodone Other (See Comments)    Severely pruritic rash  . Simvastatin Other (See Comments)    Dyspepsia   . Penicillins Rash  . Septra [Sulfamethoxazole-Trimethoprim] Rash   Review of Systems: Unable to obtain as pt is unresponsive  Physical Exam  Constitutional: She appears well-developed and well-nourished. No distress.  HENT:  Head: Normocephalic and atraumatic.  Mouth/Throat: Oropharynx is clear and moist. Mucous membranes are dry. She has dentures. Abnormal dentition. No oropharyngeal exudate.  Face moon-like  Eyes:  Eyes closed, no resistance when I open them. No EOM.   Neck:  Edema  Cardiovascular: Normal rate and regular rhythm.   Pulmonary/Chest: Effort normal. No accessory muscle usage or stridor. She has rhonchi in the left upper field and the left middle field.  Abdominal: Soft. Normal appearance and bowel sounds are normal.  Musculoskeletal:  Not moving in bed, no resistance to passive movement  Neurological: She is unresponsive.  Skin: Skin is warm. There is pallor.  Psychiatric:  UTA, pt unresponsive   Vital Signs: BP (!) 142/61 (BP Location: Right Arm)   Pulse 62   Temp (!) 100.4 F (38 C) (Axillary)   Resp 14   Ht 5' (1.524 m)   Wt 61.4 kg (135 lb 5.8 oz)   SpO2 95%   BMI 26.44 kg/m  Pain Assessment: PAINAD   Pain Score: Asleep   SpO2: SpO2: 95 % O2 Device:SpO2: 95 % O2 Flow Rate: .   IO: Intake/output summary:  Intake/Output Summary (Last 24 hours) at 08/21/16 0901 Last data filed at 08/21/16 0602  Gross per 24 hour  Intake             1450 ml  Output                0 ml  Net             1450 ml    LBM:   Baseline Weight: Weight: 61.4 kg (135 lb 5.8 oz) Most recent weight: Weight:  61.4 kg (135 lb 5.8 oz)     Palliative Assessment/Data: PPS 10%    Time Total: 50 minutes Greater than 50%  of this time was spent counseling and coordinating care related to the above assessment and plan.  Signed by: Charlynn Court, NP Palliative Medicine Team Pager # [phone] (M-F 7a-5p) Team Phone # [phone] (Nights/Weekends)

## 2016-08-22 DIAGNOSIS — Z515 Encounter for palliative care: Secondary | ICD-10-CM

## 2016-08-22 NOTE — Progress Notes (Signed)
PROGRESS NOTE    Theresa Mckinney  NIO:[id number] DOB: March 08, 1941 DOA: 08/20/2016 PCP: Josetta Huddle, MD    Brief Narrative:  76 year old female who presented to hospital with altered mental status. 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 She had declined treatment for this brain mass. Over last 2 months prior to hospitalization her functional status had been declining, over last 2 days had significant altered mentation and lethargy. On the day of admission after sustaining a fall, without head trauma, she became unresponsive. Per her family she has been coughing yellow productive phlegm over last 2 days. On initial physical examination blood pressure 146/57, heart rate 63, respiratory rate 12, temperature 98.6, oxygen saturation 94%, very lethargic and poorly responsive, mucous membranes were moist, lungs were clear to auscultation bilaterally, decreased breath sounds at the left base, heart S1-S2 present and rhythmic, abdomen was soft and nontender, no lower extremity edema. Sodium 137, potassium 4.3, chloride 104, bicarbonate 27, glucose 134, BUN 36, creatinine 1.23, white count 11.5, hemoglobin 13.4, hematocrit 39.0, platelets 140. Chest x-ray with left lower lobe infiltrate.  The patient was admitted to hospital with acute encephalopathy likely related to worsening intracranial hypertension due to likely progressive brain mass, complicated by left lower lobe pneumonia.   Assessment & Plan:   Principal Problem:   Acute metabolic encephalopathy Active Problems:   Brain mass   Essential hypertension   Malignant neoplasm of brain ([place])   Goals of care, counseling/discussion   Palliative care by specialist   Coma ([place])   Terminal care   1. Encephalopathy due to intracranial hypertension due to Large right occipital mass lesion --Ct head: "Large right occipital mass lesion, difficult to compare with the prior study due to lack of intravenous  contrast. Marked progression vasogenic edema on the right.  Marked progression of midline shift now 13 mm. 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 Negative for hemorrhage." --Very weak oculocephalic reflexes and no gag reflex. Very poor prognosis.  --On full comfort measures. Case discussed with palliative care.   2. Left lower lobe pneumonia, suspected aspiration, present on admission.   Chest film personally reviewed, noted dense infiltrate at the left lower, lobe, likely aspiration, she received Unasyn  Now on full comfort measures.   Patient with a life threatening condition, with very poor prognosis, this has been discussed with patient's family at the bedside. Patient is DNR and DNI.  On full comfort measures   DVT prophylaxis: SCD Code Status: DNR Family Communication: updated family at bedside Disposition Plan: in hospital death vs hospice facility   Consultants:   Palliative care   Procedures:  none   Antimicrobials:   Unasyn    Subjective: Patient not responsive, does not seem in pain  all history from nurse and patient's family at the bedside.   Objective: Vitals:   08/21/16 2043 08/22/16 1248 08/22/16 1258 08/22/16 1635  BP: 126/66 126/67 110/71   Pulse:   83   Resp: 20 19 18 20   Temp: 99.6 F (37.6 C) 99.5 F (37.5 C) 98.6 F (37 C) 99.3 F (37.4 C)  TempSrc: Oral Oral Axillary Axillary  SpO2: 92%  (!) 89% 91%  Weight:      Height:        Intake/Output Summary (Last 24 hours) at 08/22/16 1742 Last data filed at 08/21/16 1845  Gross per 24 hour  Intake  0 ml  Output              300 ml  Net             -300 ml   Filed Weights   08/20/16 2259  Weight: 61.4 kg (135 lb 5.8 oz)    Examination:  General exam: not responsive, does not seem in pain E ENT: mild pallor, no icterus, oral mucosa moist.  Respiratory system: Clear to auscultation. Respiratory effort normal. Decreased breath sounds at the left base,  poor inspiratory effort and positive central rhonchi.  Cardiovascular system: S1 & S2 heard, RRR. No JVD, murmurs, rubs, gallops or clicks. No pedal edema. Gastrointestinal system: Abdomen is nondistended, soft and nontender. No organomegaly or masses felt. Normal bowel sounds heard. Central nervous system: Comatose, not responsive very weak oculocephalic reflexes and no gag reflex.   Extremities: Symmetric 5 x 5 power. Skin: No rashes, lesions or ulcers     Data Reviewed: I have personally reviewed following labs and imaging studies  CBC:  Recent Labs Lab 08/20/16 1804  WBC 11.5*  NEUTROABS 9.5*  HGB 13.4  HCT 39.0  MCV 96.5  PLT 740*   Basic Metabolic Panel:  Recent Labs Lab 08/20/16 1804  NA 137  K 4.3  CL 104  CO2 27  GLUCOSE 134*  BUN 36*  CREATININE 1.23*  CALCIUM 9.0   GFR: Estimated Creatinine Clearance: 31.4 mL/min (A) (by C-G formula based on SCr of 1.23 mg/dL (H)). Liver Function Tests: No results for input(s): AST, ALT, ALKPHOS, BILITOT, PROT, ALBUMIN in the last 168 hours. No results for input(s): LIPASE, AMYLASE in the last 168 hours. No results for input(s): AMMONIA in the last 168 hours. Coagulation Profile: No results for input(s): INR, PROTIME in the last 168 hours. Cardiac Enzymes: No results for input(s): CKTOTAL, CKMB, CKMBINDEX, TROPONINI in the last 168 hours. BNP (last 3 results) No results for input(s): PROBNP in the last 8760 hours. HbA1C: No results for input(s): HGBA1C in the last 72 hours. CBG: No results for input(s): GLUCAP in the last 168 hours. Lipid Profile: No results for input(s): CHOL, HDL, LDLCALC, TRIG, CHOLHDL, LDLDIRECT in the last 72 hours. Thyroid Function Tests: No results for input(s): TSH, T4TOTAL, FREET4, T3FREE, THYROIDAB in the last 72 hours. Anemia Panel: No results for input(s): VITAMINB12, FOLATE, FERRITIN, TIBC, IRON, RETICCTPCT in the last 72 hours. Sepsis Labs: No results for input(s): PROCALCITON,  LATICACIDVEN in the last 168 hours.  Recent Results (from the past 240 hour(s))  Culture, blood (routine x 2) Call MD if unable to obtain prior to antibiotics being given     Status: None (Preliminary result)   Collection Time: 08/20/16 10:26 PM  Result Value Ref Range Status   Specimen Description BLOOD RIGHT ANTECUBITAL  Final   Special Requests   Final    BOTTLES DRAWN AEROBIC AND ANAEROBIC Blood Culture adequate volume   Culture   Final    NO GROWTH 1 DAY Performed at [facility], [address], [place], [place] [address]    Report Status PENDING  Incomplete  Culture, blood (routine x 2) Call MD if unable to obtain prior to antibiotics being given     Status: None (Preliminary result)   Collection Time: 08/20/16 10:26 PM  Result Value Ref Range Status   Specimen Description BLOOD LEFT ANTECUBITAL  Final   Special Requests IN PEDIATRIC BOTTLE Blood Culture adequate volume  Final   Culture   Final  NO GROWTH 1 DAY Performed at [facility], [place] [address], [place], [place] [address]    Report Status PENDING  Incomplete         Radiology Studies: Ct Head Wo Contrast  Result Date: 08/21/2016 CLINICAL DATA:  Encephalopathy. Brain tumor, treatment for brain tumor declined EXAM: CT HEAD WITHOUT CONTRAST TECHNIQUE: Contiguous axial images were obtained from the base of the skull through the vertex without intravenous contrast. COMPARISON:  MRI 06/24/2016, CT 06/23/2016 FINDINGS: Brain: Right occipital mass lesion is difficult to measure without intravenous contrast. There is extensive vasogenic edema in the right occipital lobe extending into the right frontal lobe with marked progression from prior studies. Increase mass-effect and midline shift now 13 mm to the left. Negative for acute hemorrhage. Progressive hydrocephalus. Third fourth and lateral ventricles all dilated. Periventricular white matter edema has progressed due to transependymal resorption of CSF from  hydrocephalus. Vascular: Negative for hyperdense vessel Skull: Negative Sinuses/Orbits: Mucosal edema paranasal sinuses with air-fluid level sphenoid sinus. Negative orbit Other: None IMPRESSION: Large right occipital mass lesion, difficult to compare with the prior study due to lack of intravenous contrast. Marked progression vasogenic edema on the right. Marked progression of midline shift now 13 mm. 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 Negative for hemorrhage. Electronically Signed   By: Franchot Gallo M.D.   On: 08/21/2016 11:24   Dg Chest Port 1 View  Result Date: 08/20/2016 CLINICAL DATA:  Patient fell today on carpet.  Right hip pain. EXAM: PORTABLE CHEST 1 VIEW COMPARISON:  06/23/2016 FINDINGS: 2001 hours. Patient is rotated to the left. Left base collapse/consolidation is new in the interval. Right lung is clear. The cardio pericardial silhouette is enlarged. The visualized bony structures of the thorax are intact. IMPRESSION: Low volume film with cardiomegaly and left base collapse/ consolidation. Pneumonia at the left lung base cannot be excluded. Electronically Signed   By: Misty Stanley M.D.   On: 08/20/2016 20:33        Scheduled Meds: . glycopyrrolate  0.2 mg Intravenous Q6H   Continuous Infusions:    LOS: 1 day       Odysseus Cada, MD PhD [facility] Pager [phone]  If 7PM-7AM, please contact night-coverage [email or web address] Password [facility] 08/22/2016, 5:42 PM

## 2016-08-22 NOTE — Progress Notes (Signed)
Daily Progress Note   Patient Name: Theresa Mckinney       Date: 08/22/2016 DOB: 1940/04/14  Age: 76 y.o. MRN#: [id number] Attending Physician: Florencia Reasons, MD Primary Care Physician: System, Provider Not In Admit Date: 08/20/2016  Reason for Consultation/Follow-up: Psychosocial/spiritual support and Terminal Care  Subjective: Theresa Mckinney remains unresponsive in bed. Her grandson is at the bedside. He relates a period early this morning of agitation with lower extremity movement, but she did not wake up and has otherwise been asleep and not moving. He feels she looks comfortable in bed this morning.  Length of Stay: 1  Current Medications: Scheduled Meds:  . dexamethasone  4 mg Intravenous Q6H  . glycopyrrolate  0.2 mg Intravenous Q6H    Continuous Infusions:   PRN Meds: acetaminophen, antiseptic oral rinse, haloperidol lactate, hydrALAZINE, LORazepam, polyvinyl alcohol  Physical Exam     Constitutional: She appears well-developed and well-nourished. No distress.  HENT:  Head: Normocephalic and atraumatic.  Facial edema Mouth/Throat: Oropharynx is clear and moist.  She has dentures. Abnormal dentition. No oropharyngeal exudate.  Brown/green secretions in mouth. No gag reflex. Eyes:  Eyes closed, no resistance when I open them. No EOM. Pupils equal. No blinking to threat. Neck:  Edema  Cardiovascular: Normal rate and regular rhythm.   Pulmonary/Chest: She has rhonchi in the left upper field and the left middle field.  No longer gargling her own secretions. Breaths shallow and more rapid, though unlabored. Abdominal: Soft. Normal appearance and bowel sounds are decreased. Musculoskeletal:  Not moving in bed, no resistance to passive movement  Neurological: She is unresponsive.  Skin: There is pallor.  LLL cool to touch foot to  mid-calf Psychiatric:  UTA, pt unresponsive       Vital Signs: BP 126/66 (BP Location: Left Arm)   Pulse 61   Temp 99.6 F (37.6 C) (Oral)   Resp 20   Ht 5' (1.524 m)   Wt 61.4 kg (135 lb 5.8 oz)   SpO2 92%   BMI 26.44 kg/m  SpO2: SpO2: 92 % O2 Device: O2 Device: Not Delivered O2 Flow Rate:    Intake/output summary:  Intake/Output Summary (Last 24 hours) at 08/22/16 3710 Last data filed at 08/21/16 1845  Gross per 24 hour  Intake               50 ml  Output              300 ml  Net             -250 ml   LBM:   Baseline Weight: Weight: 61.4 kg (135 lb 5.8 oz) Most recent weight: Weight: 61.4 kg (135 lb 5.8 oz)  Palliative Assessment/Data: PPS 10%   Flowsheet Rows     Most Recent Value  Intake Tab  Referral Department  -- [ED]  Unit at Time of Referral  ER  Palliative Care Primary Diagnosis  Cancer  Date Notified  08/20/16  Palliative Care Type  New Palliative care  Reason for referral  Clarify Goals of Care, Counsel Regarding Hospice  Date of Admission  08/20/16  Date first seen by Palliative Care  08/21/16  # of days Palliative referral response  time  1 Day(s)  # of days IP prior to Palliative referral  0  Clinical Assessment  Psychosocial & Spiritual Assessment  Palliative Care Outcomes      Patient Active Problem List   Diagnosis Date Noted  . Coma ([place]) 08/21/2016  . Malignant neoplasm of brain ([place])   . Goals of care, counseling/discussion   . Palliative care by specialist   . Acute metabolic encephalopathy 43/32/9518  . Chest pain 06/23/2016  . Brain mass 06/23/2016  . Essential hypertension 06/23/2016    Palliative Care Assessment & Plan   HPI: 76 y.o. female  with past medical history of brain mass (found in April, likely primary GBM with treatment declined), HTN, renal cell carcinoma s/p left nephrectomy, and anxiety. She presented to the ED with worsening confusion, lethargy, and a fall. These symptoms started ~2 days prior to admission.  She was admitted on 08/20/2016.  Imaging suggestive of LLL PNA, with plan for antibiotics. She is followed by [facility] as an outpatient. Palliative consulted to assist in goals of care.  Assessment: CT head obtained yesterday, which showed: "Large right occipital mass lesion, difficult to compare with the prior study due to lack of intravenous contrast. Marked progression vasogenic edema on the right. Marked progression of midline shift now 13 mm. 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 Negative for hemorrhage." Given these findings in the context of her clinical presentation, I suspect she is in the process of herniation. Her family is clear that she would not want any aggressive or invasive measures taken to prolong her life. Given her wishes, her clinical presentation, and the brain imaging, I recommended transition to full comfort measures. Her son (who is her 43) and the rest of her family were fully in support of this. She was transitioned to full comfort measures on 6/12 in the afternoon.   [place] remains unresponsive this morning. Her breathing pattern has transitioned to more rapid shallow breaths (hyperventilating), and she is not blinking to threat or gagging with stimulation. The timeline to death is uncertain, however I suspect it will be hours to days. I shared my expected prognosis with her grandson, and provided a basic explanation for what was occurring in her brain and what symptoms I was assessing for. I also reinforced the role he can play in advocating for her if any symptoms present, as well as the value he brings being present at her bedside.   Recommendations/Plan:  DNR, continue full comfort measures  Will stop steroids; at this point they provide little benefit as she is unresponsive (so no symptomatic benefit), and will not alter her course  I am hesitant to transition her out of the hospital given the acuity of her decline, and her family's strong fear of  her dying in transport. I would recommend continue to watch and support her today, and re-assess for stability for transport tomorrow.   Goals of Care and Additional Recommendations:  Limitations on Scope of Treatment: Full Comfort Care  Code Status:  DNR  Prognosis:   Hours - Days  Discharge Planning:  To Be Determined  Care plan was discussed with pt's grandson, care nurse, primary team.  Thank you for allowing the Palliative Medicine Team to assist in the care of this patient.  Total time: 35 minutes    Greater than 50%  of this time was spent counseling and coordinating care related to the above assessment and plan.  Charlynn Court, NP Palliative Medicine Team [phone] pager (7a-5p) Team  Phone # [phone]

## 2016-08-22 NOTE — Progress Notes (Addendum)
Date:  August 22, 2016  Chart reviewed for concurrent status and case management needs.  Will continue to follow patient progress.  Discharge Planning: Spoke with [place] not expecting to take patient home/did discuss residental hospice if needed with Grand-son. Expected discharge date: [id number]  Velva Harman, [place], [place], [place]

## 2016-08-22 NOTE — Progress Notes (Signed)
Nutrition Brief Note  Patient identified on Low Braden Score list.   Chart reviewed. Pt now transitioning to comfort care.  No nutrition interventions warranted at this time.   Clayton Bibles, MS, RD, LDN Pager: [phone] After Hours Pager: [phone]

## 2016-08-23 MED ORDER — ACETAMINOPHEN 650 MG RE SUPP: 650.0000 mg | Freq: Four times a day (QID) | RECTAL | 0 refills | Status: DC | PRN

## 2016-08-23 NOTE — Progress Notes (Signed)
CSW reviewed call from liaison Olivia Mackie at [facility], she is assisting family with patient transition to [facility] residential hospice. CSW following to assist with discharge.   Kathrin Greathouse, Latanya Presser, MSW Clinical Social Worker 5E and Psychiatric Service Line [phone] 08/23/2016  9:15 AM

## 2016-08-23 NOTE — Progress Notes (Signed)
Daily Progress Note   Patient Name: Theresa Mckinney       Date: 08/23/2016 DOB: [birth year]  Age: 76 y.o. MRN#: [id number] Attending Physician: Florencia Reasons, MD Primary Care Physician: Josetta Huddle, MD Admit Date: 08/20/2016  Reason for Consultation/Follow-up: Psychosocial/spiritual support and Terminal Care  Subjective: Theresa Mckinney remains unresponsive in bed. Her sister is at the bedside.   Sister says that the patient is how she has been since Friday when she was brought to the ED after a fall.  See below.   Length of Stay: 2  Current Medications: Scheduled Meds:  . glycopyrrolate  0.2 mg Intravenous Q6H    Continuous Infusions:   PRN Meds: acetaminophen, antiseptic oral rinse, haloperidol lactate, hydrALAZINE, LORazepam, polyvinyl alcohol  Physical Exam     Constitutional: She appears well-developed and well-nourished. No distress.  HENT:  Head: Normocephalic and atraumatic.  Facial edema Mouth/Throat: Oropharynx is clear and moist.  She has dentures. Abnormal dentition. No oropharyngeal exudate.  Brown/green secretions in mouth. No gag reflex. Eyes:  Eyes closed, no resistance when I open them. No EOM. Pupils equal. No blinking to threat. Neck:  Edema  Cardiovascular: Normal rate and regular rhythm.   Pulmonary/Chest: rapid shallow breathing.  Abdominal: Soft. Normal appearance and bowel sounds are decreased. Musculoskeletal:  Not moving in bed, no resistance to passive movement  Neurological: She is unresponsive.  Skin: There is pallor.  LLL cool to touch foot, lower extremities are how ever warm to touch, no mottling noted.  Psychiatric:  UTA, pt unresponsive       Vital Signs: BP 110/71 (BP Location: Left Arm)   Pulse 65   Temp 98.9 F (37.2 C) (Oral)   Resp 20   Ht 5' (1.524 m)   Wt 61.4 kg (135 lb 5.8 oz)   SpO2  94%   BMI 26.44 kg/m  SpO2: SpO2: 94 % O2 Device: O2 Device: Not Delivered O2 Flow Rate:    Intake/output summary:   Intake/Output Summary (Last 24 hours) at 08/23/16 0826 Last data filed at 08/23/16 0500  Gross per 24 hour  Intake                0 ml  Output                0 ml  Net                0 ml   LBM:   Baseline Weight: Weight: 61.4 kg (135 lb 5.8 oz) Most recent weight: Weight: 61.4 kg (135 lb 5.8 oz)  Palliative Assessment/Data: PPS 10%   Flowsheet Rows     Most Recent Value  Intake Tab  Referral Department  -- [ED]  Unit at Time of Referral  ER  Palliative Care Primary Diagnosis  Cancer  Date Notified  08/20/16  Palliative Care Type  New Palliative care  Reason for referral  Clarify Goals of Care, Counsel Regarding Hospice  Date of Admission  08/20/16  Date first seen by Palliative Care  08/21/16  # of days Palliative referral response time  1 Day(s)  # of days IP prior to Palliative referral  0  Clinical Assessment  Psychosocial & Spiritual Assessment  Palliative Care Outcomes      Patient Active Problem List   Diagnosis Date Noted  . Terminal care   . Coma ([place]) 08/21/2016  . Malignant neoplasm of brain ([place])   . Goals of care, counseling/discussion   . Palliative care by specialist   . Acute metabolic encephalopathy 70/35/0093  . Chest pain 06/23/2016  . Brain mass 06/23/2016  . Essential hypertension 06/23/2016    Palliative Care Assessment & Plan   HPI: 76 y.o. female  with past medical history of brain mass (found in April, likely primary GBM with treatment declined), HTN, renal cell carcinoma s/p left nephrectomy, and anxiety. She presented to the ED with worsening confusion, lethargy, and a fall. These symptoms started ~2 days prior to admission. She was admitted on 08/20/2016.  Imaging suggestive of LLL PNA, with plan for antibiotics. She is followed by [facility] as an outpatient. Palliative consulted to assist in goals of  care.  Assessment: CT head  showed: "Large right occipital mass lesion, difficult to compare with the prior study due to lack of intravenous contrast. Marked progression vasogenic edema on the right. Marked progression of midline shift now 13 mm. 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 Negative for hemorrhage."   Patient has since been on comfort measures.    I met with the patient's sister holding vigil by the bedside this morning, we talked about end of life signs and symptoms.  See below  Recommendations/Plan:  DNR, continue full comfort measures  Discussed with sister present in the room about transfer to residential hospice, she says that the family, including the patient's son have started having discussions about [facility] and are open to considering it as an option.   Appreciate Tracy from [facility], she will reach out to the son to discuss further about transferring the patient to residential hospice.   Goals of Care and Additional Recommendations:  Limitations on Scope of Treatment: Full Comfort Care  Code Status:  DNR  Prognosis:    Days  Discharge Planning:  ?residential hospice   Care plan was discussed with pt's sister, Dr Dallie Dad from [facility]. .  Thank you for allowing the Palliative Medicine Team to assist in the care of this patient.  Total time: 25 minutes    Greater than 50%  of this time was spent counseling and coordinating care related to the above assessment and plan.  Loistine Chance MD Palliative Medicine Team [phone] ( 7A to [place]) Team Phone # [phone]

## 2016-08-23 NOTE — Progress Notes (Signed)
0815--[facility] RN Visit for [facility]  Received request from PMT for family interest in [facility] with request for transfer today. Chart reviewed and discussion with Dr. Rowe Pavy at bedside with sister, Pamala Hurry who expressed family had discussion last evening about transferring today. Pamala Hurry asked that I reach out to son, Tommie Raymond to confirm interest.  Phone call to Tommie Raymond confirmed interest in moving to [facility] today. Met with Tommie Raymond and Pamala Hurry to explain services. Registration paperwork completed. Family is agreeable to transfer today.    Dr. Josetta Huddle to continue care for patient per family request.  Please fax discharge summary to [phone].  RN please call report to [phone].  Please arrange for transport with [facility] for patient to arrive as soon as possible.  Thank you, Margaretmary Eddy, [place] [phone]  [place] are on AMION.

## 2016-08-23 NOTE — Discharge Summary (Signed)
Discharge Summary  Theresa Mckinney ZOX:[id number] DOB: 1940-11-25  PCP: Josetta Huddle, MD  Admit date: 08/20/2016 Discharge date: 08/23/2016  Time spent: <74mins  Patient is to discharge to residential hospice on full comfort measures  Discharge Diagnoses:  Active Hospital Problems   Diagnosis Date Noted  . Acute metabolic encephalopathy 81/19/1478  . Terminal care   . Coma ([place]) 08/21/2016  . Malignant neoplasm of brain ([facility] [place] [facility])   . Goals of care, counseling/discussion   . Palliative care by specialist   . Brain mass 06/23/2016  . Essential hypertension 06/23/2016    Resolved Hospital Problems   Diagnosis Date Noted Date Resolved  . GBM (glioblastoma multiforme) ([place]) 08/20/2016 08/20/2016    Discharge Condition: stable  Diet recommendation: as tolerated  Filed Weights   08/20/16 2259  Weight: 61.4 kg (135 lb 5.8 oz)    History of present illness:  Patient coming from: Home  Chief Complaint: AMS  HPI: Theresa Mckinney is a 76 y.o. 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  Declined treatment for this (invasive or otherwise).  Has been slowly declining over past 2 months until last 2 days when she had significant worsening of AMS.  Increased lethargy.  Fall at home today.  Will occasionally wake up, answer one question, or take a pill, then fall back asleep.  Patient brought in by family due to increased lethargy and likely increased care needs.  Family also notes cough since noon today.   Hospital Course:  Principal Problem:   Acute metabolic encephalopathy Active Problems:   Brain mass   Essential hypertension   Malignant neoplasm of brain ([facility])   Goals of care, counseling/discussion   Palliative care by specialist   Coma ([place])   Terminal care   1. Encephalopathy due to intracranial hypertension due to Large right occipital mass lesion --Ct head: "Large right occipital mass lesion, difficult to compare with  the prior study due to lack of intravenous contrast. Marked progression vasogenic edema on the right.  Marked progression of midline shift now 13 mm. 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 Negative for hemorrhage." --Very weak oculocephalic reflexes and no gag reflex. Very poor prognosis.  --On full comfort measures. Case discussed with palliative care and hospice , she is to discharge to residential hospice.  2. Left lower lobe pneumonia, suspected aspiration, present on admission.   Chest film personally reviewed, noted dense infiltrate at the left lower, lobe, likely aspiration, she received Unasyn  Now on full comfort measures.   Patient with a life threatening condition, with very poor prognosis, this has been discussed with patient's family at the bedside. Patient is DNR and DNI.  On full comfort measures    Code Status: DNR Family Communication: updated family at bedside Disposition Plan: discharge to hospice facility   Consultants:   Palliative care   hospice  Procedures:  none   Antimicrobials:   Unasyn   Discharge Exam: BP 110/71 (BP Location: Left Arm)   Pulse 65   Temp 98.9 F (37.2 C) (Oral)   Resp 20   Ht 5' (1.524 m)   Wt 61.4 kg (135 lb 5.8 oz)   SpO2 94%   BMI 26.44 kg/m   General: not responsive, does not seem in pain or discomfort Cardiovascular: deferred Respiratory: not in respiratory distress    Allergies as of 08/23/2016      Reactions   Aristocort [triamcinolone] Other (See Comments)   Skin  felt like it was burning   Ciprofloxacin Hcl Other (See Comments)   Paresthesia   Codeine Nausea Only   Demerol [meperidine] Nausea Only   Influenza Vaccine Live Swelling, Other (See Comments)   Arm swelling   Macrolides And Ketolides Nausea Only   Morphine And Related Nausea Only   Other Other (See Comments)   Biphosphonates - Unknown   Oxycodone Other (See Comments)   Severely pruritic rash   Simvastatin  Other (See Comments)   Dyspepsia    Penicillins Rash   Septra [sulfamethoxazole-trimethoprim] Rash      Medication List    STOP taking these medications   acetaminophen 500 MG tablet Commonly known as:  TYLENOL   cholecalciferol 1000 units tablet Commonly known as:  VITAMIN D   clorazepate 3.75 MG tablet Commonly known as:  TRANXENE   dexamethasone 2 MG tablet Commonly known as:  DECADRON   Flax Seed Oil 1000 MG Caps   hydrALAZINE 25 MG tablet Commonly known as:  APRESOLINE   loratadine 10 MG tablet Commonly known as:  CLARITIN   nebivolol 5 MG tablet Commonly known as:  BYSTOLIC      Allergies  Allergen Reactions  . Aristocort [Triamcinolone] Other (See Comments)    Skin felt like it was burning  . Ciprofloxacin Hcl Other (See Comments)    Paresthesia  . Codeine Nausea Only  . Demerol [Meperidine] Nausea Only  . Influenza Vaccine Live Swelling and Other (See Comments)    Arm swelling  . Macrolides And Ketolides Nausea Only  . Morphine And Related Nausea Only  . Other Other (See Comments)    Biphosphonates - Unknown  . Oxycodone Other (See Comments)    Severely pruritic rash  . Simvastatin Other (See Comments)    Dyspepsia   . Penicillins Rash  . Septra [Sulfamethoxazole-Trimethoprim] Rash      The results of significant diagnostics from this hospitalization (including imaging, microbiology, ancillary and laboratory) are listed below for reference.    Significant Diagnostic Studies: Ct Head Wo Contrast  Result Date: 08/21/2016 CLINICAL DATA:  Encephalopathy. Brain tumor, treatment for brain tumor declined EXAM: CT HEAD WITHOUT CONTRAST TECHNIQUE: Contiguous axial images were obtained from the base of the skull through the vertex without intravenous contrast. COMPARISON:  MRI 06/24/2016, CT 06/23/2016 FINDINGS: Brain: Right occipital mass lesion is difficult to measure without intravenous contrast. There is extensive vasogenic edema in the right occipital  lobe extending into the right frontal lobe with marked progression from prior studies. Increase mass-effect and midline shift now 13 mm to the left. Negative for acute hemorrhage. Progressive hydrocephalus. Third fourth and lateral ventricles all dilated. Periventricular white matter edema has progressed due to transependymal resorption of CSF from hydrocephalus. Vascular: Negative for hyperdense vessel Skull: Negative Sinuses/Orbits: Mucosal edema paranasal sinuses with air-fluid level sphenoid sinus. Negative orbit Other: None IMPRESSION: Large right occipital mass lesion, difficult to compare with the prior study due to lack of intravenous contrast. Marked progression vasogenic edema on the right. Marked progression of midline shift now 13 mm. 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 Negative for hemorrhage. Electronically Signed   By: Franchot Gallo M.D.   On: 08/21/2016 11:24   Dg Chest Port 1 View  Result Date: 08/20/2016 CLINICAL DATA:  Patient fell today on carpet.  Right hip pain. EXAM: PORTABLE CHEST 1 VIEW COMPARISON:  06/23/2016 FINDINGS: 2001 hours. Patient is rotated to the left. Left base collapse/consolidation is new in the interval. Right lung is clear. The  cardio pericardial silhouette is enlarged. The visualized bony structures of the thorax are intact. IMPRESSION: Low volume film with cardiomegaly and left base collapse/ consolidation. Pneumonia at the left lung base cannot be excluded. Electronically Signed   By: Misty Stanley M.D.   On: 08/20/2016 20:33   Dg Hip Unilat  With Pelvis 2-3 Views Right  Result Date: 08/20/2016 CLINICAL DATA:  Right hip pain after fall today. EXAM: DG HIP (WITH OR WITHOUT PELVIS) 2-3V RIGHT COMPARISON:  None. FINDINGS: There is no evidence of hip fracture or dislocation. There is no evidence of arthropathy or other focal bone abnormality. IMPRESSION: Normal right hip. Electronically Signed   By: Marijo Conception, M.D.   On: 08/20/2016 14:26      Microbiology: Recent Results (from the past 240 hour(s))  Culture, blood (routine x 2) Call MD if unable to obtain prior to antibiotics being given     Status: None (Preliminary result)   Collection Time: 08/20/16 10:26 PM  Result Value Ref Range Status   Specimen Description BLOOD RIGHT ANTECUBITAL  Final   Special Requests   Final    BOTTLES DRAWN AEROBIC AND ANAEROBIC Blood Culture adequate volume   Culture   Final    NO GROWTH 1 DAY Performed at [facility], [address] [place], [place] [address]    Report Status PENDING  Incomplete  Culture, blood (routine x 2) Call MD if unable to obtain prior to antibiotics being given     Status: None (Preliminary result)   Collection Time: 08/20/16 10:26 PM  Result Value Ref Range Status   Specimen Description BLOOD LEFT ANTECUBITAL  Final   Special Requests IN PEDIATRIC BOTTLE Blood Culture adequate volume  Final   Culture   Final    NO GROWTH 1 DAY Performed at [facility], [place] [address] [place], [place] [address]    Report Status PENDING  Incomplete     Labs: Basic Metabolic Panel:  Recent Labs Lab 08/20/16 1804  NA 137  K 4.3  CL 104  CO2 27  GLUCOSE 134*  BUN 36*  CREATININE 1.23*  CALCIUM 9.0   Liver Function Tests: No results for input(s): AST, ALT, ALKPHOS, BILITOT, PROT, ALBUMIN in the last 168 hours. No results for input(s): LIPASE, AMYLASE in the last 168 hours. No results for input(s): AMMONIA in the last 168 hours. CBC:  Recent Labs Lab 08/20/16 1804  WBC 11.5*  NEUTROABS 9.5*  HGB 13.4  HCT 39.0  MCV 96.5  PLT 140*   Cardiac Enzymes: No results for input(s): CKTOTAL, CKMB, CKMBINDEX, TROPONINI in the last 168 hours. BNP: BNP (last 3 results) No results for input(s): BNP in the last 8760 hours.  ProBNP (last 3 results) No results for input(s): PROBNP in the last 8760 hours.  CBG: No results for input(s): GLUCAP in the last 168 hours.     SignedFlorencia Reasons MD,  PhD  [facility] 08/23/2016, 10:33 AM

## 2016-08-23 NOTE — Progress Notes (Signed)
Report called to Stephanie at [facility] 

## 2016-08-23 NOTE — Progress Notes (Signed)
[facility] called for Transport 

## 2016-08-23 NOTE — Progress Notes (Signed)
Date: August 23, 2016  Discharge orders review for case management needs.  None found  Per patient or family member no additional needs at home. Velva Harman, BSN, [place], CCM:  [phone]

## 2016-08-26 LAB — CULTURE, BLOOD (ROUTINE X 2)
Culture: NO GROWTH
Culture: NO GROWTH
Special Requests: ADEQUATE
Special Requests: ADEQUATE

## 2016-09-09 DEATH — deceased

## 2017-11-06 IMAGING — CT CT ABD-PELV W/O CM
2 of 5 series · 14 of 46 positions shown, 16 images · IV contrast (iopamidol)
Comparison: Brain MR 06/24/2016. Chest radiograph 06/23/2016.
Abdominopelvic CT of 08/25/2010.

CLINICAL DATA: Brain tumor. Evaluate for primary malignancy.
History of renal cell carcinoma.

EXAM:
CT CHEST, ABDOMEN, AND PELVIS WITH CONTRAST
TECHNIQUE: Multidetector CT imaging of the chest, abdomen and pelvis was
performed following the standard protocol during bolus
administration of intravenous contrast.
CONTRAST:  75mL 1NLLBP-4UU IOPAMIDOL (1NLLBP-4UU) INJECTION 61%

[Series 5: cap w/o 3.0 mm st cor · coronal · non-contrast · 0.63mm/px · 3 of 117 slices shown]
[im 52/117  soft-tissue]
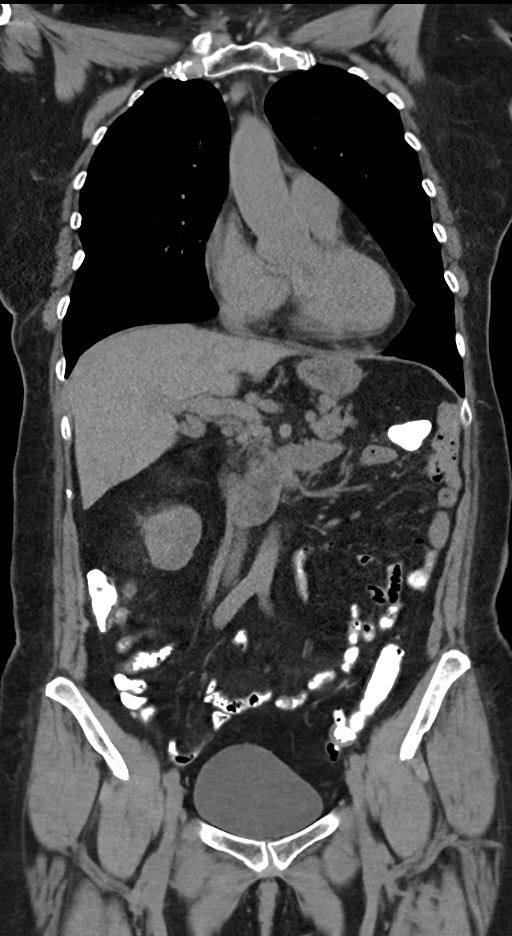
[im 65/117  soft-tissue]
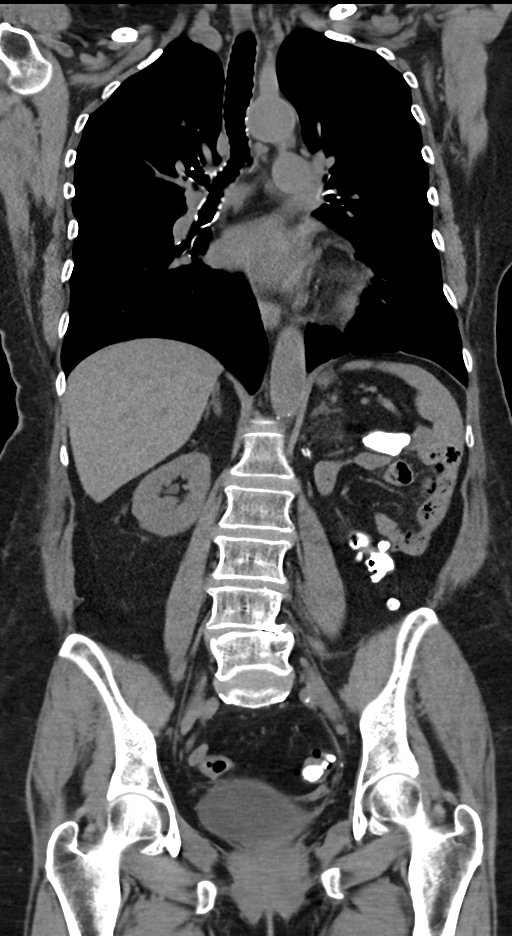
[im 78/117  soft-tissue]
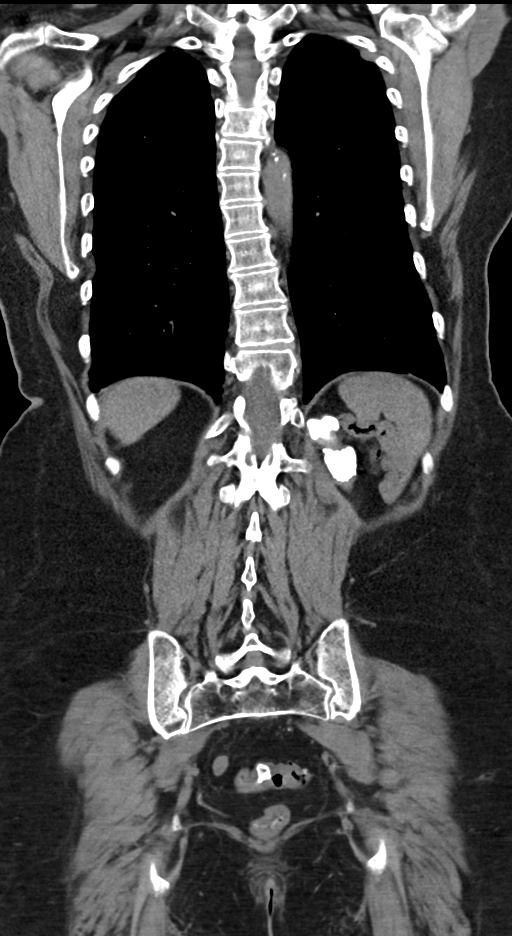

[Series 8: cap w/o 2.0 mm st · axial · non-contrast · 0.69mm/px · z∈[-537,-19]mm · 11 of 297 slices shown, 13 images]
[im 19/297  soft-tissue]
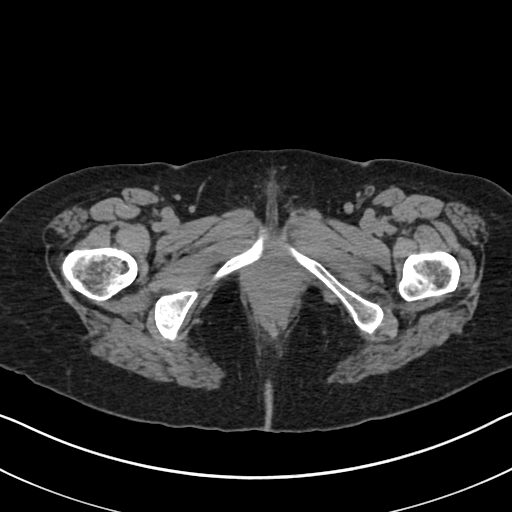
[im 19/297  bone]
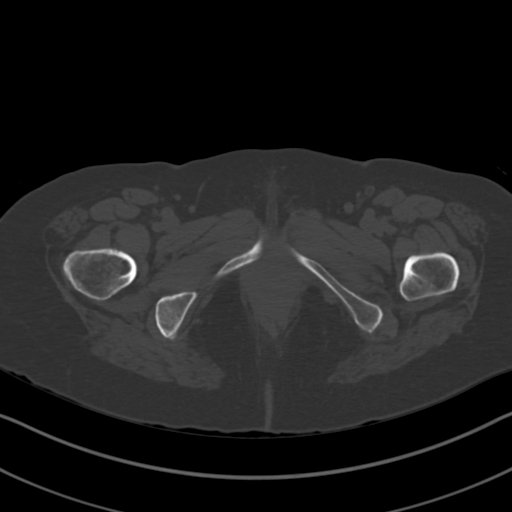
[im 56/297  soft-tissue]
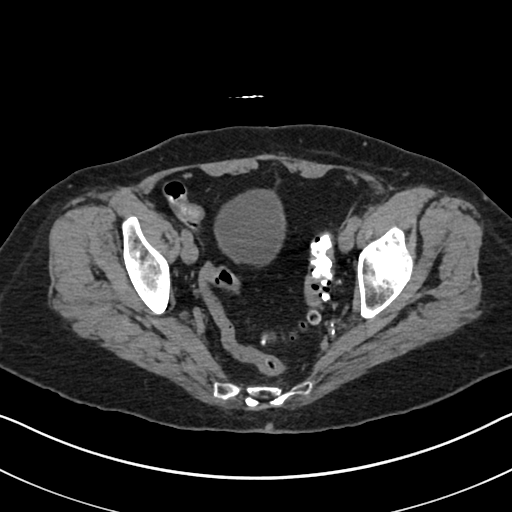
[im 75/297  soft-tissue]
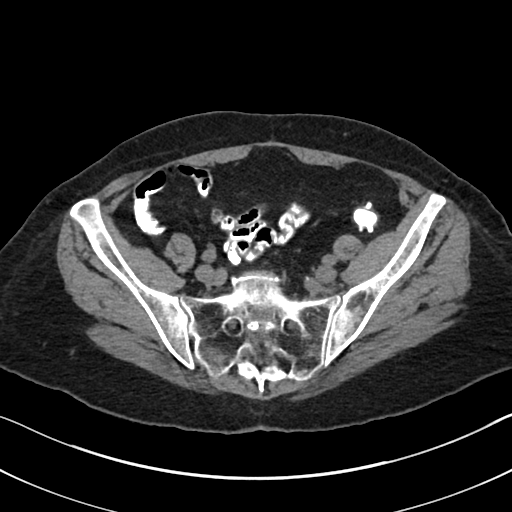
[im 93/297  soft-tissue]
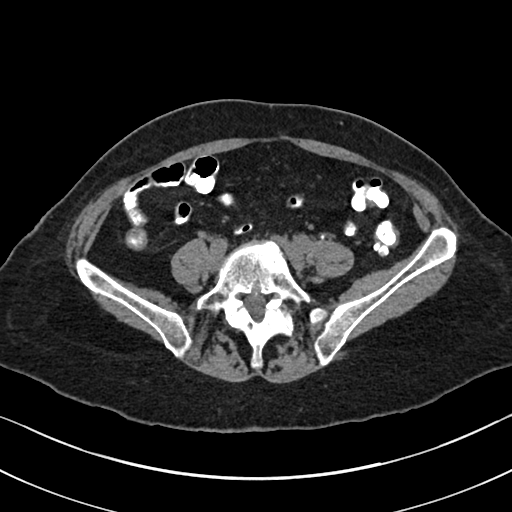
[im 130/297  soft-tissue]
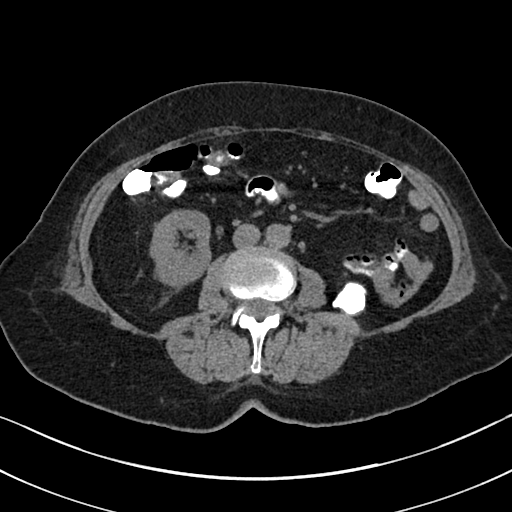
[im 149/297  soft-tissue]
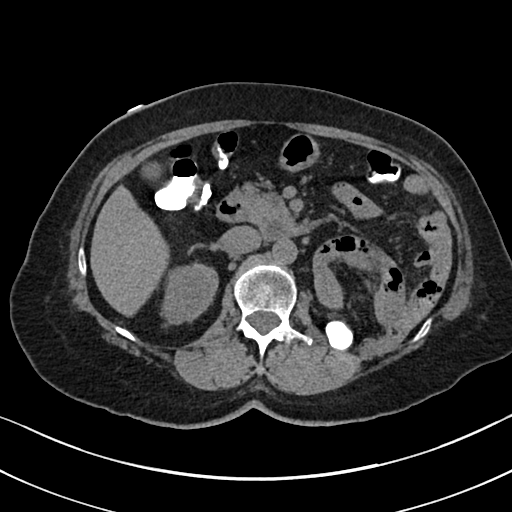
[im 167/297  soft-tissue]
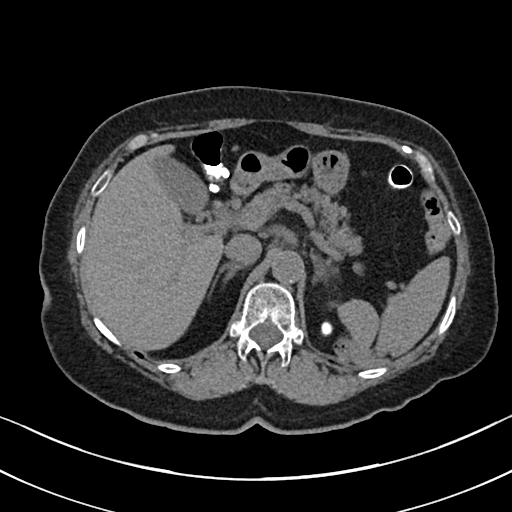
[im 204/297  soft-tissue]
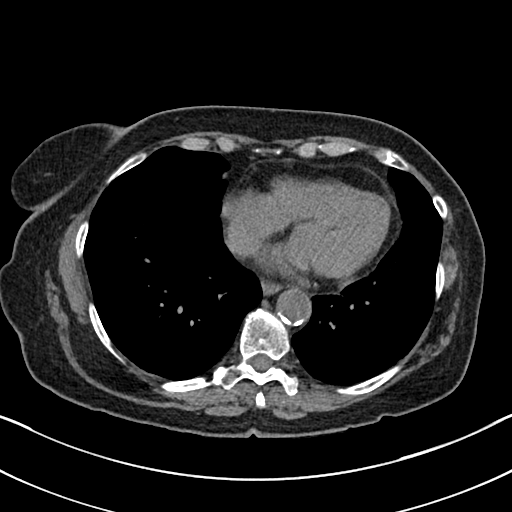
[im 223/297  soft-tissue]
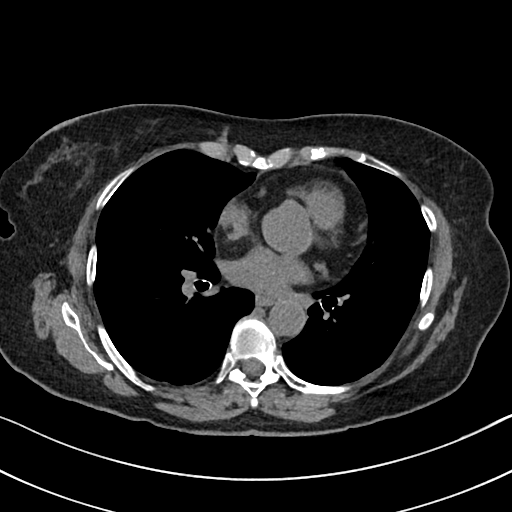
[im 223/297  bone]
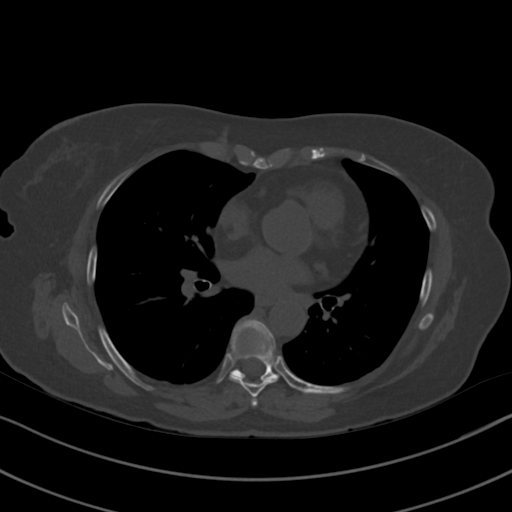
[im 241/297  soft-tissue]
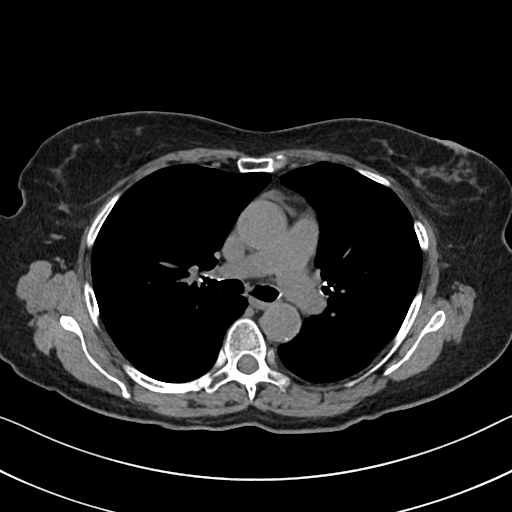
[im 278/297  soft-tissue]
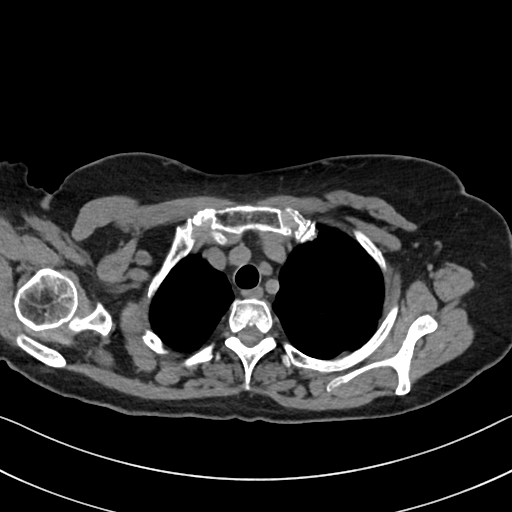

[14 of 46 positions shown; findings below may reference images not displayed]

FINDINGS: CT CHEST FINDINGS

Cardiovascular: Aortic and branch vessel atherosclerosis. Mild
cardiomegaly with lipomatous hypertrophy of the interatrial septum.
No pericardial effusion. Lad coronary artery atherosclerosis.

Mediastinum/Nodes: No supraclavicular adenopathy. No mediastinal or
definite hilar adenopathy, given limitations of unenhanced CT. Tiny
hiatal hernia.

Lungs/Pleura: No pleural fluid. Minimal biapical pleural-parenchymal
scarring. Left base scarring.

Musculoskeletal: No acute osseous abnormality.

CT ABDOMEN PELVIS FINDINGS

Hepatobiliary: Normal liver. Normal gallbladder, without biliary
ductal dilatation.

Pancreas: Normal, without mass or ductal dilatation.

Spleen: Normal in size, without focal abnormality.

Adrenals/Urinary Tract: Mild left adrenal thickening. Normal right
adrenal gland. Left nephrectomy, without locally recurrent disease.
Normal right kidney, without hydronephrosis or hydroureter. Normal
urinary bladder.

Stomach/Bowel: Normal remainder of the stomach. Scattered colonic
diverticula. Underdistended terminal and distal ileum. Appendix not
visualized. Normal small bowel.

Vascular/Lymphatic: Aortic atherosclerosis. No abdominopelvic
adenopathy.

Reproductive: Hysterectomy.  No adnexal mass.

Other: No significant free fluid.  Mild pelvic floor laxity.

Musculoskeletal: Mild osteopenia. Bilateral L5 pars defects.
Degenerative disc disease at L4-5. S shaped thoracolumbar spine
curvature.
IMPRESSION: 1. No evidence of primary malignancy within the chest, abdomen, or
pelvis.
2. Left nephrectomy, without recurrent or metastatic disease.
3.  Coronary artery atherosclerosis. Aortic atherosclerosis.
4.  Tiny hiatal hernia.

## 2018-01-02 IMAGING — CR DG HIP (WITH OR WITHOUT PELVIS) 2-3V*R*
3 series · 3 of 3 positions shown · non-contrast
Comparison: None.

CLINICAL DATA: Right hip pain after fall today.

EXAM:
DG HIP (WITH OR WITHOUT PELVIS) 2-3V RIGHT

[t pelvis ap (1 of 2)]
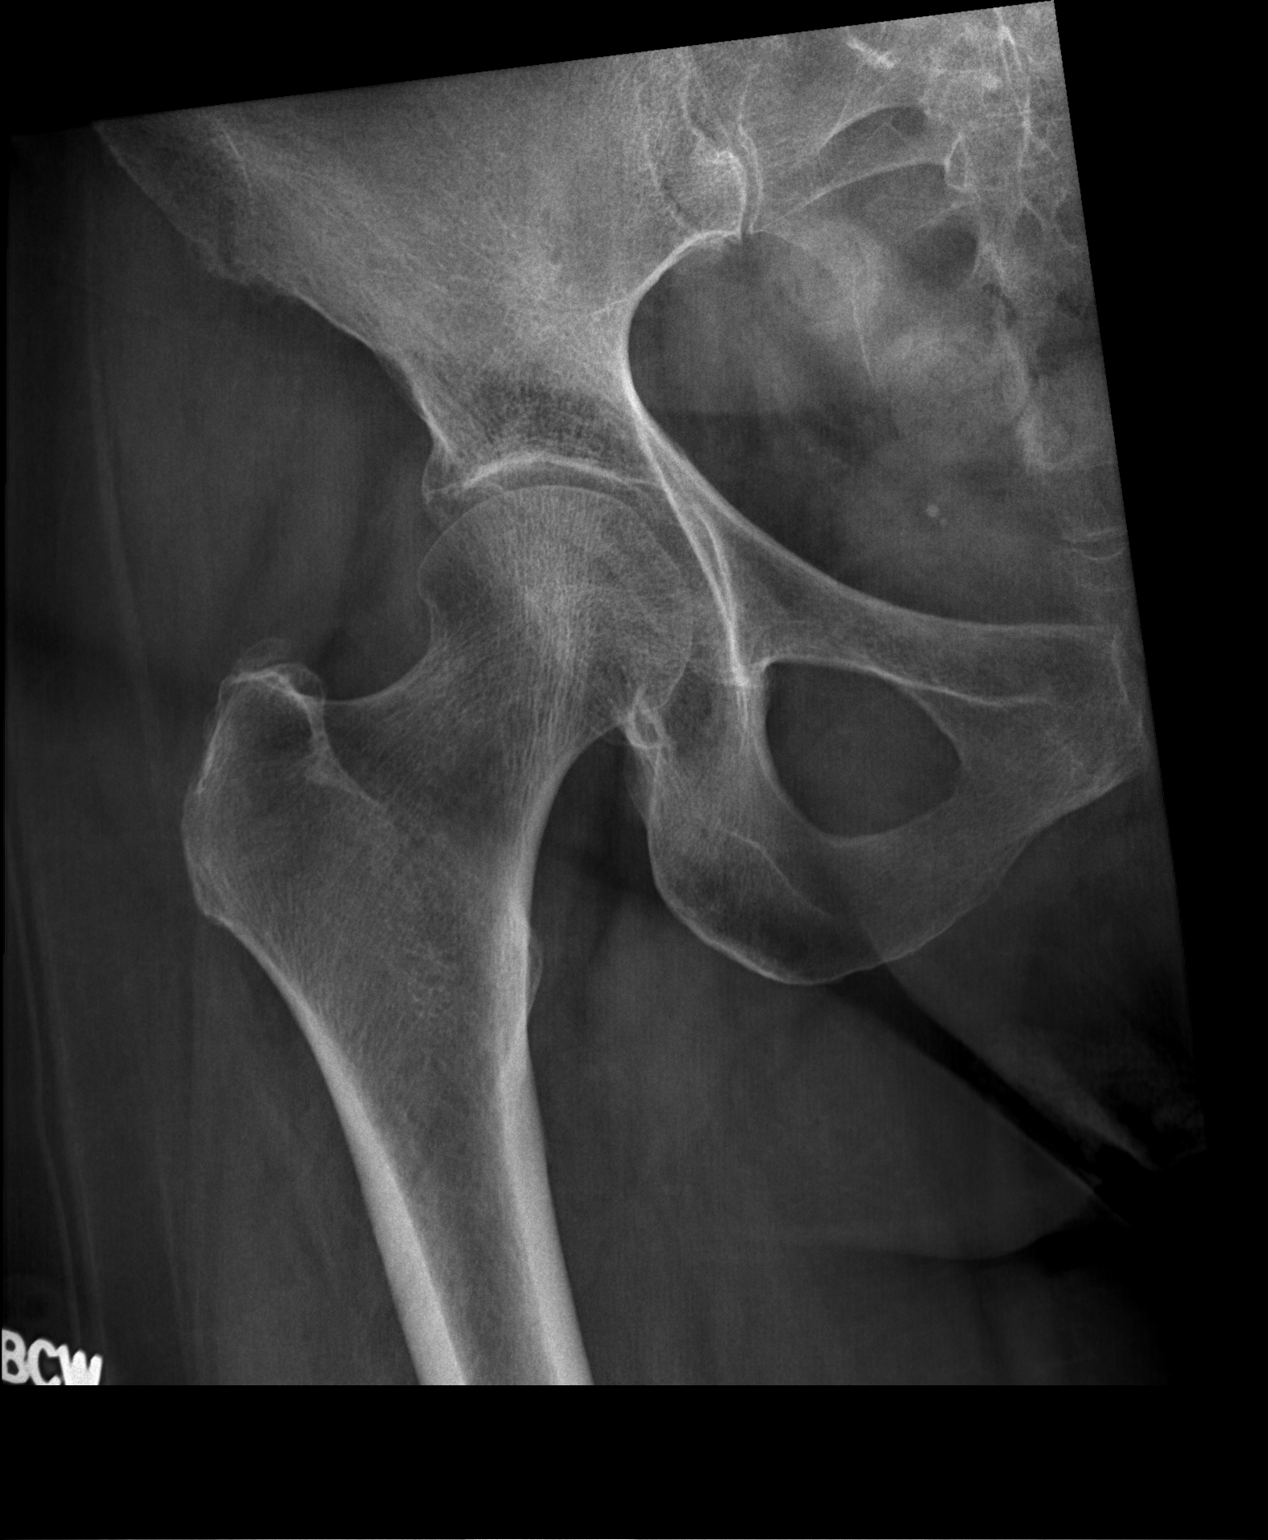

[t pelvis ap (2 of 2)]
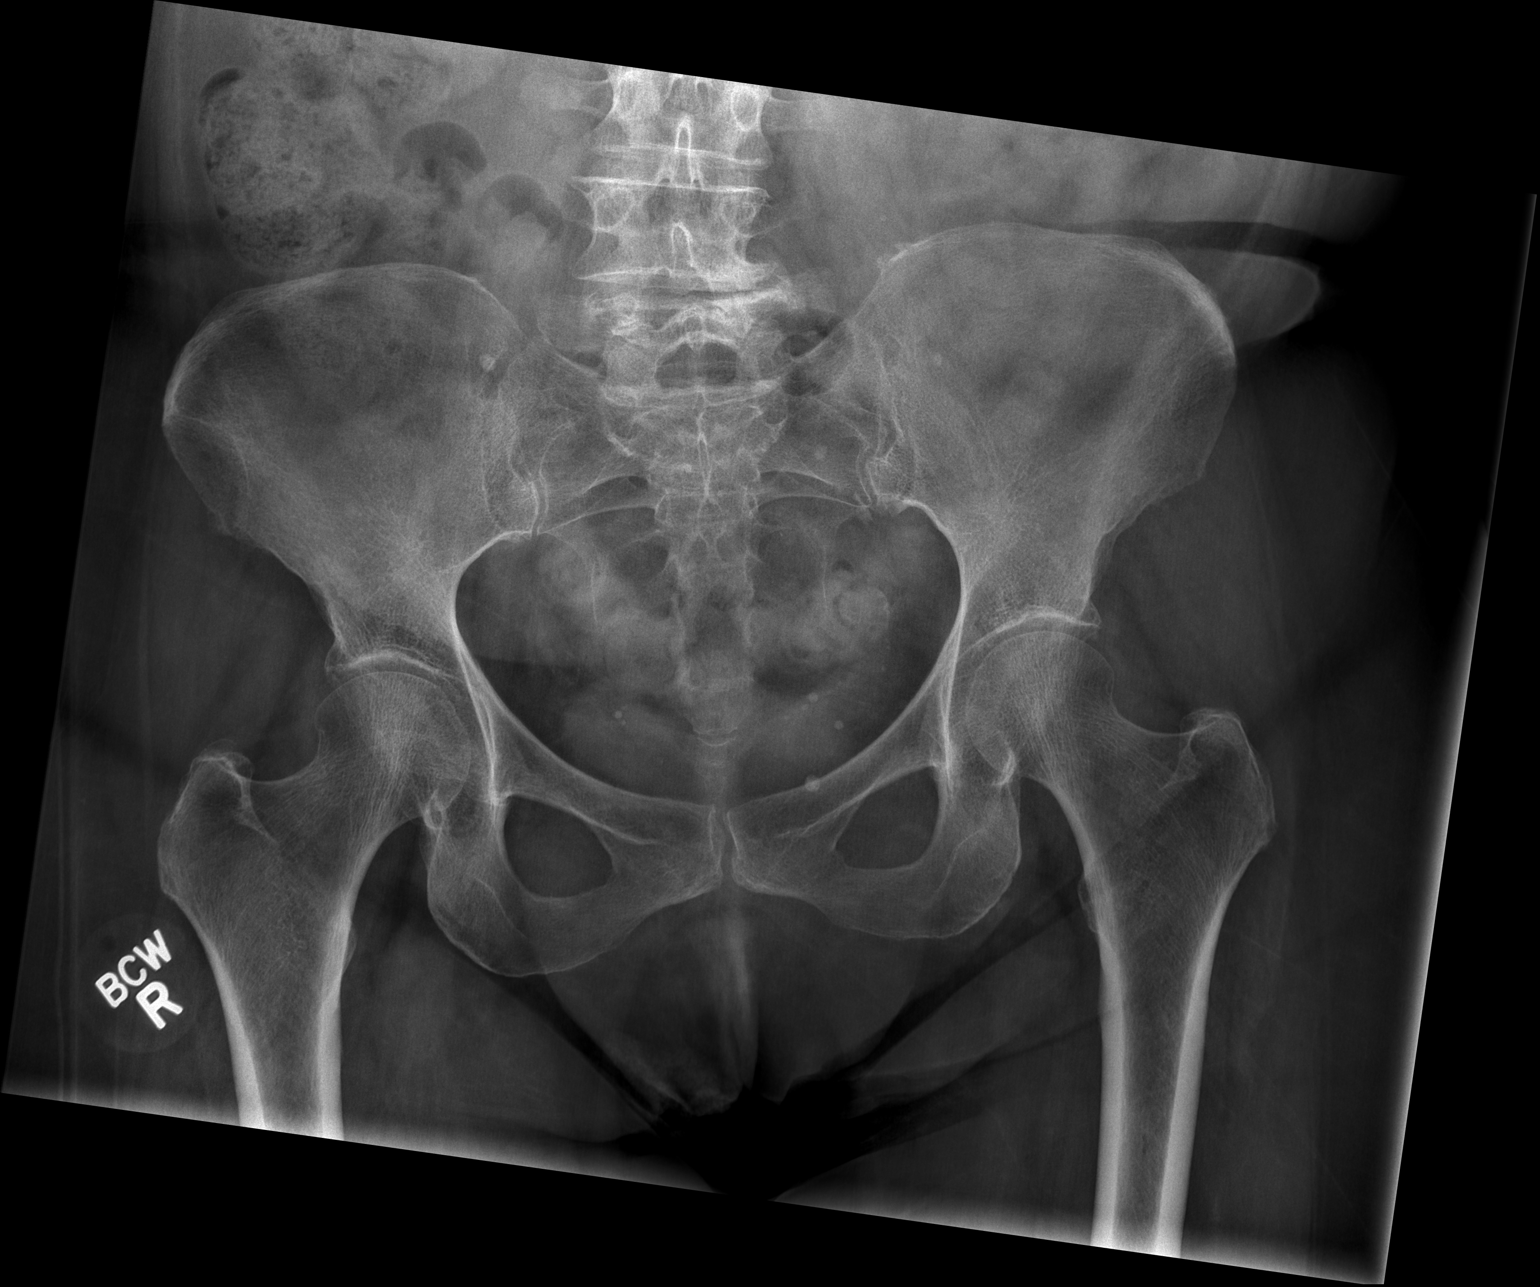

[t hip frog leg right]
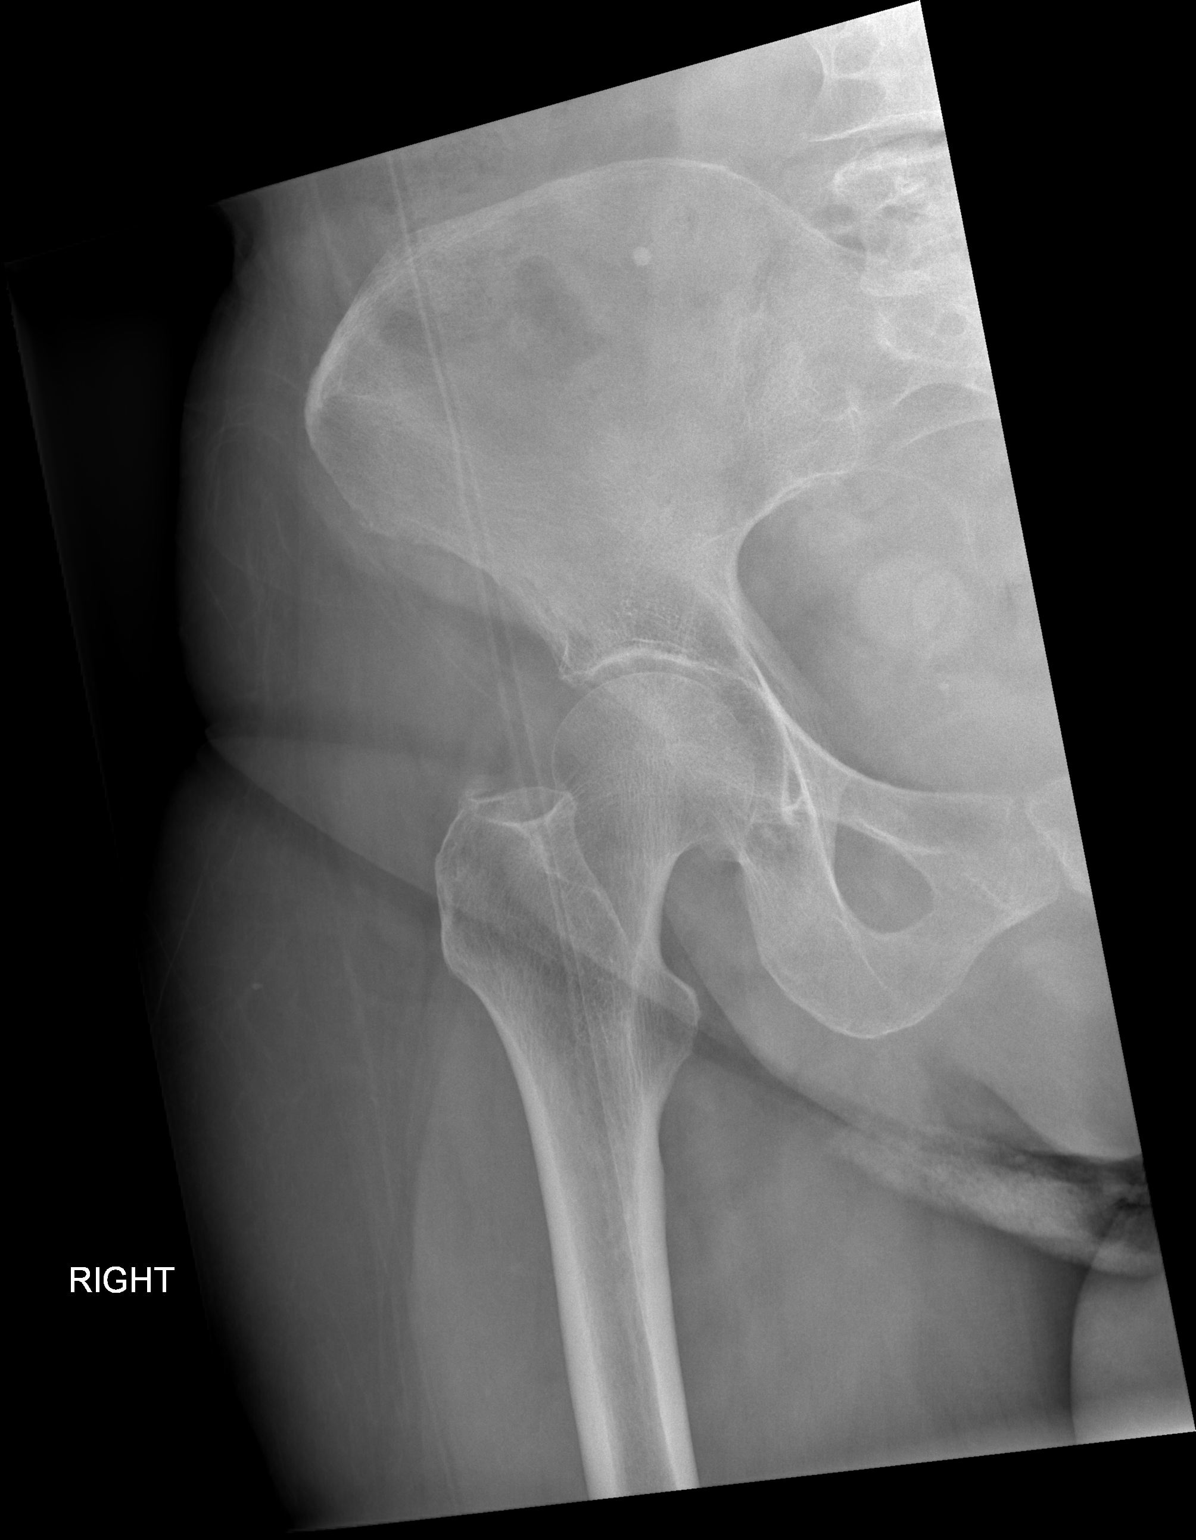

[3 of 3 positions shown; findings below may reference images not displayed]

FINDINGS: There is no evidence of hip fracture or dislocation. There is no
evidence of arthropathy or other focal bone abnormality.
IMPRESSION: Normal right hip.
